# Patient Record
Sex: Male | Born: 1970 | Race: White | Hispanic: No | Marital: Married | Smoking: Current some day smoker
Health system: Southern US, Community
[De-identification: ages and names within clinical notes are randomized; demographics above are authoritative.]

## PROBLEM LIST (undated history)

## (undated) DIAGNOSIS — K219 Gastro-esophageal reflux disease without esophagitis: Secondary | ICD-10-CM

## (undated) HISTORY — PX: BACK SURGERY: SHX140

## (undated) HISTORY — DX: Gastro-esophageal reflux disease without esophagitis: K21.9

---

## 2006-11-29 ENCOUNTER — Ambulatory Visit: Payer: Self-pay | Admitting: Family Medicine

## 2008-09-29 ENCOUNTER — Emergency Department: Payer: Self-pay | Admitting: Emergency Medicine

## 2010-08-15 ENCOUNTER — Ambulatory Visit: Payer: Self-pay | Admitting: Family Medicine

## 2010-08-19 ENCOUNTER — Ambulatory Visit: Payer: Self-pay | Admitting: Family Medicine

## 2012-04-28 ENCOUNTER — Ambulatory Visit: Payer: Self-pay | Admitting: Family Medicine

## 2013-07-04 ENCOUNTER — Ambulatory Visit: Payer: Self-pay | Admitting: Family Medicine

## 2015-03-19 ENCOUNTER — Ambulatory Visit (INDEPENDENT_AMBULATORY_CARE_PROVIDER_SITE_OTHER): Payer: BC Managed Care – PPO | Admitting: Family Medicine

## 2015-03-19 ENCOUNTER — Encounter: Payer: Self-pay | Admitting: Family Medicine

## 2015-03-19 VITALS — BP 120/62 | HR 76 | Ht 72.0 in | Wt 226.0 lb

## 2015-03-19 DIAGNOSIS — J0100 Acute maxillary sinusitis, unspecified: Secondary | ICD-10-CM

## 2015-03-19 DIAGNOSIS — K219 Gastro-esophageal reflux disease without esophagitis: Secondary | ICD-10-CM

## 2015-03-19 DIAGNOSIS — J40 Bronchitis, not specified as acute or chronic: Secondary | ICD-10-CM | POA: Diagnosis not present

## 2015-03-19 MED ORDER — PANTOPRAZOLE SODIUM 40 MG PO TBEC: 40.0000 mg | DELAYED_RELEASE_TABLET | Freq: Every day | ORAL | Status: DC

## 2015-03-19 MED ORDER — AMOXICILLIN 500 MG PO CAPS: 500.0000 mg | ORAL_CAPSULE | Freq: Three times a day (TID) | ORAL | Status: DC

## 2015-03-19 MED ORDER — GUAIFENESIN-CODEINE 100-10 MG/5ML PO SOLN: 5.0000 mL | Freq: Three times a day (TID) | ORAL | Status: DC | PRN

## 2015-03-19 NOTE — Patient Instructions (Signed)
Smoking: Ways to Quit   Know why you want to quit.   When you quit smoking, your body gets to work repairing damaged tissues. Here are some of the health benefits:   You stop the destruction of your lungs.  Your lungs are better able to fight colds, and other respiratory infections.  You decrease your risk of cancer, heart disease, strokes, and circulation problems.   In addition, when you quit you will:   Feel more in control of your life.  Have better smelling hair, breath, clothes, home, and car.  Have more stamina for activities.  Save money.  Protect your family and friends from the dangers of secondhand smoke.   Smoking is an addictive habit. Most former smokers make several attempts to quit before they finally succeed. So, never say, "I can't." Just keep trying.   Set a quit date.   Set a date for when you will stop smoking. Don't buy cigarettes to carry you beyond your last day. Tell your family and friends you plan to quit, and ask for their support and encouragement. Ask them not to offer you cigarettes.  Make a plan.   5 Days Before Your Quit Date   Think about your reasons for quitting.  Tell your friends and family you are planning to quit.  Stop buying cigarettes.   4 Days Before Your Quit Date   Pay attention to when and why you smoke.  Think of other things to hold in your hand instead of a cigarette.  Think of habits or routines to change.   3 Days Before Your Quit Date   Plan what you will do with the extra money when you stop buying cigarettes.  Think of whom you can reach out to when you need help.   2 Days Before Your Quit Date   Consider buying nonprescription nicotine patches or nicotine gum. Or see your health care provider to get a prescription for the nicotine inhaler, nasal spray, or other medicine that can help.     1 Day Before Your Quit Date   Put away lighters and ashtrays.  Throw away all cigarettes and matches - no emergency stashes  are allowed!  Clean your clothes to get rid of the smell of cigarette smoke.   Quit Day   Keep very busy.  Remind family and friends that this is your quit day.  Stay away from alcohol.  Stay away from places where you used to smoke and people you used to smoke with.  Give yourself a treat or do something else special.   Commit to staying quit.   Make sure that all your cigarettes and ashtrays are thrown away.   If you keep cigarettes or ashtrays around, sooner or later you'll break down and smoke one, then another, then another, and so on. Throw them away. Make it hard to start again.   Because you are used to having something in your mouth, you may want to chew gum as a substitute for smoking. Or munch on carrots or celery.   Spend time with nonsmokers rather than with smokers.   Think of yourself as a nonsmoker. Tell other people that you are a nonsmoker (for example, in restaurants). Stay away from places where there are a lot of smokers, such as bars. Avoid spending time with smokers. You can't tell others not to smoke, but you don't have to sit with them while they do. Plan on walking away from cigarette smoke. Spend time   with nonsmokers and sit in the nonsmoking section of restaurants.   Be prepared for relapse or difficult situations.   Most people who go back to smoking cigarettes do so within the first 3 months after quitting. Many people try 5 or more times before they successfully quit. Avoid drinking alcohol, because it lowers your chances of success. Don't be distracted by the weight you may gain after quitting. Smokers usually do not gain more than 10 pounds when they stop smoking. Learn new ways to improve your mood and overcome depression.   Start an exercise program.   As you become more fit, you will not want the nicotine effects in your body. Regular exercise will also help keep you from gaining weight.   Keep your hands busy.   You may not know what to do with  your hands for a while. Try reading, knitting, needlework, pottery, drawing, making a plastic model, or doing a jigsaw puzzle. Join special interest groups that keep you involved in your hobby.      Take on new activities.   Change your routine. Take on new activities that don't include smoking. Join an exercise group and work out regularly. Sign up for an evening class or a join a study group at your place of worship. Go on more outings with your family or friends. Learn ways to relax and manage stress.   Join a quit-smoking program if it helps.   Some people do better in groups, or with a set of instructions to follow. That's fine, too. Remember, the goal is to quit smoking. It doesn't matter how you do it.   Consider using nicotine replacement therapy.   Nicotine is the drug that is in tobacco. You can use nicotine patches or gum, available without a prescription at your local pharmacy, to help you quit smoking. Quitting smoking is a two-step process. It includes breaking the physical addiction to nicotine and breaking the smoking habit. Nicotine replacement helps take care of the nicotine addiction so that you can focus on breaking the habit.   Your health care provider can prescribe nicotine substitutes that can almost double your chances of quitting for good. They are:   Zyban (bupropion HCL)  nicotine inhaler  nicotine lozenge  nicotine nasal spray  nicotine patch.   None of these treatments is a miracle cure. Quitting can be hard work, but you can learn to live without cigarettes in your daily life.  

## 2015-03-19 NOTE — Progress Notes (Signed)
Name: Ricardo RainwaterGary G Capriotti   MRN: [id number]    DOB: July 02, 1970   Date:03/19/2015       Progress Note  Subjective  Chief Complaint  Chief Complaint  Patient presents with  . Sinusitis    coughing at night    Sinusitis This is a new problem. The current episode started in the past 7 days. The problem has been gradually worsening since onset. There has been no fever. His pain is at a severity of 2/10 ("dull headache"). The pain is mild. Associated symptoms include congestion, coughing, headaches, sinus pressure, sneezing and a sore throat. Pertinent negatives include no chills, diaphoresis, ear pain, hoarse voice, neck pain, shortness of breath or swollen glands. Past treatments include acetaminophen and oral decongestants. The treatment provided mild relief.    No problem-specific assessment & plan notes found for this encounter.   Past Medical History  Diagnosis Date  . GERD (gastroesophageal reflux disease)     Past Surgical History  Procedure Laterality Date  . Back surgery      History reviewed. No pertinent family history.  Social History   Social History  . Marital Status: Married    Spouse Name: N/A  . Number of Children: N/A  . Years of Education: N/A   Occupational History  . Not on file.   Social History Main Topics  . Smoking status: Current Some Day Smoker  . Smokeless tobacco: Not on file  . Alcohol Use: No  . Drug Use: No  . Sexual Activity: Yes   Other Topics Concern  . Not on file   Social History Narrative  . No narrative on file    No Known Allergies   Review of Systems  Constitutional: Negative for fever, chills, weight loss, malaise/fatigue and diaphoresis.  HENT: Positive for congestion, sinus pressure, sneezing and sore throat. Negative for ear discharge, ear pain and hoarse voice.   Eyes: Negative for blurred vision.  Respiratory: Positive for cough. Negative for sputum production, shortness of breath and wheezing.        Prod green   Cardiovascular: Negative for chest pain, palpitations and leg swelling.  Gastrointestinal: Positive for heartburn. Negative for nausea, abdominal pain, diarrhea, constipation, blood in stool and melena.  Genitourinary: Negative for dysuria, urgency, frequency and hematuria.  Musculoskeletal: Negative for myalgias, back pain, joint pain and neck pain.  Skin: Negative for rash.  Neurological: Positive for headaches. Negative for dizziness, tingling, sensory change and focal weakness.  Endo/Heme/Allergies: Negative for environmental allergies and polydipsia. Does not bruise/bleed easily.  Psychiatric/Behavioral: Negative for depression and suicidal ideas. The patient is not nervous/anxious and does not have insomnia.      Objective  Filed Vitals:   03/19/15 0917  BP: 120/62  Pulse: 76  Height: 6' (1.829 m)  Weight: 226 lb (102.513 kg)    Physical Exam  Constitutional: He is oriented to person, place, and time and well-developed, well-nourished, and in no distress.  HENT:  Head: Normocephalic.  Right Ear: External ear normal.  Left Ear: External ear normal.  Nose: Nose normal.  Mouth/Throat: Oropharynx is clear and moist.  Eyes: Conjunctivae and EOM are normal. Pupils are equal, round, and reactive to light. Right eye exhibits no discharge. Left eye exhibits no discharge. No scleral icterus.  Neck: Normal range of motion. Neck supple. No JVD present. No tracheal deviation present. No thyromegaly present.  Cardiovascular: Normal rate, regular rhythm, normal heart sounds and intact distal pulses.  Exam reveals no gallop and no friction rub.  No murmur heard. Pulmonary/Chest: Breath sounds normal. No respiratory distress. He has no wheezes. He has no rales.  Abdominal: Soft. Bowel sounds are normal. He exhibits no mass. There is no hepatosplenomegaly. There is no tenderness. There is no rebound, no guarding and no CVA tenderness.  Musculoskeletal: Normal range of motion. He exhibits no  edema or tenderness.  Lymphadenopathy:    He has no cervical adenopathy.  Neurological: He is alert and oriented to person, place, and time. He has normal sensation, normal strength, normal reflexes and intact cranial nerves. No cranial nerve deficit.  Skin: Skin is warm. No rash noted.  Psychiatric: Mood and affect normal.      Assessment & Plan  Problem List Items Addressed This Visit    None    Visit Diagnoses    Acute maxillary sinusitis, recurrence not specified    -  Primary    Relevant Medications    amoxicillin (AMOXIL) 500 MG capsule    guaiFENesin-codeine 100-10 MG/5ML syrup    Bronchitis        Relevant Medications    guaiFENesin-codeine 100-10 MG/5ML syrup    Gastroesophageal reflux disease, esophagitis presence not specified        Relevant Medications    pantoprazole (PROTONIX) 40 MG tablet         Dr. Hayden [facility]  03/19/2015

## 2015-04-23 ENCOUNTER — Encounter: Payer: BC Managed Care – PPO | Admitting: Family Medicine

## 2015-04-26 ENCOUNTER — Ambulatory Visit (INDEPENDENT_AMBULATORY_CARE_PROVIDER_SITE_OTHER): Payer: BC Managed Care – PPO | Admitting: Family Medicine

## 2015-04-26 ENCOUNTER — Encounter: Payer: Self-pay | Admitting: Family Medicine

## 2015-04-26 VITALS — BP 100/60 | HR 68 | Ht 73.0 in | Wt 222.0 lb

## 2015-04-26 DIAGNOSIS — E785 Hyperlipidemia, unspecified: Secondary | ICD-10-CM | POA: Diagnosis not present

## 2015-04-26 DIAGNOSIS — Z0000 Encounter for general adult medical examination without abnormal findings: Secondary | ICD-10-CM | POA: Diagnosis not present

## 2015-04-26 DIAGNOSIS — F17219 Nicotine dependence, cigarettes, with unspecified nicotine-induced disorders: Secondary | ICD-10-CM

## 2015-04-26 DIAGNOSIS — S161XXA Strain of muscle, fascia and tendon at neck level, initial encounter: Secondary | ICD-10-CM

## 2015-04-26 LAB — HEMOCCULT GUIAC POC 1CARD (OFFICE): Fecal Occult Blood, POC: NEGATIVE

## 2015-04-26 NOTE — Progress Notes (Signed)
Name: Ricardo Daniels   MRN: [id number]    DOB: 09-25-1970   Date:04/26/2015       Progress Note  Subjective  Chief Complaint  Chief Complaint  Patient presents with  . Annual Exam  . Neck Pain    feels like "tension on Left side of neck"    Neck Pain  This is a recurrent problem. The current episode started more than 1 year ago. The problem occurs daily. The problem has been waxing and waning. The pain is associated with nothing. The pain is present in the left side. The quality of the pain is described as aching. The pain is at a severity of 3/10. The pain is mild. The symptoms are aggravated by twisting. Pertinent negatives include no chest pain, fever, headaches, leg pain, numbness, pain with swallowing, paresis, photophobia, syncope, tingling, trouble swallowing, visual change, weakness or weight loss. He has tried nothing for the symptoms. The treatment provided no relief.    No problem-specific assessment & plan notes found for this encounter.   Past Medical History  Diagnosis Date  . GERD (gastroesophageal reflux disease)     Past Surgical History  Procedure Laterality Date  . Back surgery      History reviewed. No pertinent family history.  Social History   Social History  . Marital Status: Married    Spouse Name: N/A  . Number of Children: N/A  . Years of Education: N/A   Occupational History  . Not on file.   Social History Main Topics  . Smoking status: Current Some Day Smoker  . Smokeless tobacco: Not on file  . Alcohol Use: No  . Drug Use: No  . Sexual Activity: Yes   Other Topics Concern  . Not on file   Social History Narrative    No Known Allergies   Review of Systems  Constitutional: Negative for fever, chills, weight loss and malaise/fatigue.  HENT: Negative for ear discharge, ear pain, sore throat and trouble swallowing.   Eyes: Negative for blurred vision and photophobia.  Respiratory: Negative for cough, sputum production, shortness of  breath and wheezing.   Cardiovascular: Negative for chest pain, palpitations, leg swelling and syncope.  Gastrointestinal: Negative for heartburn, nausea, abdominal pain, diarrhea, constipation, blood in stool and melena.  Genitourinary: Negative for dysuria, urgency, frequency and hematuria.  Musculoskeletal: Positive for neck pain. Negative for myalgias, back pain and joint pain.  Skin: Negative for rash.  Neurological: Negative for dizziness, tingling, sensory change, focal weakness, weakness, numbness and headaches.  Endo/Heme/Allergies: Negative for environmental allergies and polydipsia. Does not bruise/bleed easily.  Psychiatric/Behavioral: Negative for depression and suicidal ideas. The patient is not nervous/anxious and does not have insomnia.      Objective  Filed Vitals:   04/26/15 0955  BP: 100/60  Pulse: 68  Height: 6\' 1"  (1.854 m)  Weight: 222 lb (100.699 kg)    Physical Exam  Constitutional: He is oriented to person, place, and time and well-developed, well-nourished, and in no distress.  HENT:  Head: Normocephalic.  Right Ear: External ear normal.  Left Ear: External ear normal.  Nose: Nose normal.  Mouth/Throat: Oropharynx is clear and moist.  Eyes: Conjunctivae and EOM are normal. Pupils are equal, round, and reactive to light. Right eye exhibits no discharge. Left eye exhibits no discharge. No scleral icterus.  Neck: Normal range of motion. Neck supple. No JVD present. No tracheal deviation present. No thyromegaly present.  Cardiovascular: Normal rate, regular rhythm, normal heart sounds and  intact distal pulses.  Exam reveals no gallop and no friction rub.   No murmur heard. Pulmonary/Chest: Breath sounds normal. No respiratory distress. He has no wheezes. He has no rales.  Abdominal: Soft. Bowel sounds are normal. He exhibits no mass. There is no hepatosplenomegaly. There is no tenderness. There is no rebound, no guarding and no CVA tenderness.   Musculoskeletal: Normal range of motion. He exhibits no edema or tenderness.  Lymphadenopathy:    He has no cervical adenopathy.  Neurological: He is alert and oriented to person, place, and time. He has normal sensation, normal strength, normal reflexes and intact cranial nerves. No cranial nerve deficit.  Skin: Skin is warm. No rash noted.  Psychiatric: Mood and affect normal.      Assessment & Plan  Problem List Items Addressed This Visit    None        Dr. Elizabeth Sauereanna Erwin [facility]ishiyama [facility]  04/26/2015

## 2015-04-26 NOTE — Patient Instructions (Signed)
Cervical Sprain  A cervical sprain is an injury in the neck in which the strong, fibrous tissues (ligaments) that connect your neck bones stretch or tear. Cervical sprains can range from mild to severe. Severe cervical sprains can cause the neck vertebrae to be unstable. This can lead to damage of the spinal cord and can result in serious nervous system problems. The amount of time it takes for a cervical sprain to get better depends on the cause and extent of the injury. Most cervical sprains heal in 1 to 3 weeks.  CAUSES   Severe cervical sprains may be caused by:    Contact sport injuries (such as from football, rugby, wrestling, hockey, auto racing, gymnastics, diving, martial arts, or boxing).    Motor vehicle collisions.    Whiplash injuries. This is an injury from a sudden forward and backward whipping movement of the head and neck.   Falls.   Mild cervical sprains may be caused by:    Being in an awkward position, such as while cradling a telephone between your ear and shoulder.    Sitting in a chair that does not offer proper support.    Working at a poorly designed computer station.    Looking up or down for long periods of time.   SYMPTOMS    Pain, soreness, stiffness, or a burning sensation in the front, back, or sides of the neck. This discomfort may develop immediately after the injury or slowly, 24 hours or more after the injury.    Pain or tenderness directly in the middle of the back of the neck.    Shoulder or upper back pain.    Limited ability to move the neck.    Headache.    Dizziness.    Weakness, numbness, or tingling in the hands or arms.    Muscle spasms.    Difficulty swallowing or chewing.    Tenderness and swelling of the neck.   DIAGNOSIS   Most of the time your health care provider can diagnose a cervical sprain by taking your history and doing a physical exam. Your health care provider will ask about previous neck injuries and any known neck  problems, such as arthritis in the neck. X-rays may be taken to find out if there are any other problems, such as with the bones of the neck. Other tests, such as a CT scan or MRI, may also be needed.   TREATMENT   Treatment depends on the severity of the cervical sprain. Mild sprains can be treated with rest, keeping the neck in place (immobilization), and pain medicines. Severe cervical sprains are immediately immobilized. Further treatment is done to help with pain, muscle spasms, and other symptoms and may include:   Medicines, such as pain relievers, numbing medicines, or muscle relaxants.    Physical therapy. This may involve stretching exercises, strengthening exercises, and posture training. Exercises and improved posture can help stabilize the neck, strengthen muscles, and help stop symptoms from returning.   HOME CARE INSTRUCTIONS    Put ice on the injured area.     Put ice in a plastic bag.     Place a towel between your skin and the bag.     Leave the ice on for 15-20 minutes, 3-4 times a day.    If your injury was severe, you may have been given a cervical collar to wear. A cervical collar is a two-piece collar designed to keep your neck from moving while it heals.      Do not remove the collar unless instructed by your health care provider.    If you have long hair, keep it outside of the collar.    Ask your health care provider before making any adjustments to your collar. Minor adjustments may be required over time to improve comfort and reduce pressure on your chin or on the back of your head.    Ifyou are allowed to remove the collar for cleaning or bathing, follow your health care provider's instructions on how to do so safely.    Keep your collar clean by wiping it with mild soap and water and drying it completely. If the collar you have been given includes removable pads, remove them every 1-2 days and hand wash them with soap and water. Allow them to air dry. They should be completely  dry before you wear them in the collar.    If you are allowed to remove the collar for cleaning and bathing, wash and dry the skin of your neck. Check your skin for irritation or sores. If you see any, tell your health care provider.    Do not drive while wearing the collar.    Only take over-the-counter or prescription medicines for pain, discomfort, or fever as directed by your health care provider.    Keep all follow-up appointments as directed by your health care provider.    Keep all physical therapy appointments as directed by your health care provider.    Make any needed adjustments to your workstation to promote good posture.    Avoid positions and activities that make your symptoms worse.    Warm up and stretch before being active to help prevent problems.   SEEK MEDICAL CARE IF:    Your pain is not controlled with medicine.    You are unable to decrease your pain medicine over time as planned.    Your activity level is not improving as expected.   SEEK IMMEDIATE MEDICAL CARE IF:    You develop any bleeding.   You develop stomach upset.   You have signs of an allergic reaction to your medicine.    Your symptoms get worse.    You develop new, unexplained symptoms.    You have numbness, tingling, weakness, or paralysis in any part of your body.   MAKE SURE YOU:    Understand these instructions.   Will watch your condition.   Will get help right away if you are not doing well or get worse.     This information is not intended to replace advice given to you by your health care provider. Make sure you discuss any questions you have with your health care provider.     Document Released: 01/25/2007 Document Revised: 04/04/2013 Document Reviewed: 10/05/2012  Elsevier Interactive Patient Education 2016 [employer]

## 2015-04-27 LAB — RENAL FUNCTION PANEL
Albumin: 4.4 g/dL (ref 3.5–5.5)
BUN / CREAT RATIO: 15 (ref 9–20)
BUN: 17 mg/dL (ref 6–24)
CO2: 25 mmol/L (ref 18–29)
CREATININE: 1.16 mg/dL (ref 0.76–1.27)
Calcium: 9.6 mg/dL (ref 8.7–10.2)
Chloride: 103 mmol/L (ref 96–106)
GFR, EST AFRICAN AMERICAN: 87 mL/min/{1.73_m2} (ref >59)
GFR, EST NON AFRICAN AMERICAN: 75 mL/min/{1.73_m2} (ref >59)
Glucose: 88 mg/dL (ref 65–99)
Phosphorus: 4.1 mg/dL (ref 2.5–4.5)
Potassium: 4.5 mmol/L (ref 3.5–5.2)
SODIUM: 141 mmol/L (ref 134–144)

## 2015-04-27 LAB — LIPID PANEL
CHOL/HDL RATIO: 4.1 ratio (ref 0.0–5.0)
Cholesterol, Total: 158 mg/dL (ref 100–199)
HDL: 39 mg/dL — AB (ref >39)
LDL CALC: 107 mg/dL — AB (ref 0–99)
TRIGLYCERIDES: 59 mg/dL (ref 0–149)
VLDL CHOLESTEROL CAL: 12 mg/dL (ref 5–40)

## 2015-07-23 ENCOUNTER — Ambulatory Visit
Admission: EM | Admit: 2015-07-23 | Discharge: 2015-07-23 | Disposition: A | Discharge status: Home / Self Care | Payer: BC Managed Care – PPO | Attending: Family Medicine | Admitting: Family Medicine

## 2015-07-23 ENCOUNTER — Encounter: Payer: Self-pay | Admitting: *Deleted

## 2015-07-23 ENCOUNTER — Ambulatory Visit (INDEPENDENT_AMBULATORY_CARE_PROVIDER_SITE_OTHER): Payer: BC Managed Care – PPO

## 2015-07-23 DIAGNOSIS — Z79899 Other long term (current) drug therapy: Secondary | ICD-10-CM | POA: Insufficient documentation

## 2015-07-23 DIAGNOSIS — M940 Chondrocostal junction syndrome [Tietze]: Secondary | ICD-10-CM | POA: Insufficient documentation

## 2015-07-23 DIAGNOSIS — R0602 Shortness of breath: Secondary | ICD-10-CM | POA: Diagnosis not present

## 2015-07-23 DIAGNOSIS — F17200 Nicotine dependence, unspecified, uncomplicated: Secondary | ICD-10-CM | POA: Diagnosis not present

## 2015-07-23 DIAGNOSIS — R079 Chest pain, unspecified: Secondary | ICD-10-CM | POA: Diagnosis not present

## 2015-07-23 DIAGNOSIS — R109 Unspecified abdominal pain: Secondary | ICD-10-CM | POA: Insufficient documentation

## 2015-07-23 DIAGNOSIS — K219 Gastro-esophageal reflux disease without esophagitis: Secondary | ICD-10-CM | POA: Diagnosis not present

## 2015-07-23 DIAGNOSIS — R05 Cough: Secondary | ICD-10-CM | POA: Diagnosis not present

## 2015-07-23 DIAGNOSIS — Z9889 Other specified postprocedural states: Secondary | ICD-10-CM | POA: Insufficient documentation

## 2015-07-23 NOTE — ED Notes (Signed)
Patient reports symptom of left flank pain started 2 days ago. Pain increases during deep breathing and when he moves his left arm.

## 2015-07-23 NOTE — ED Provider Notes (Signed)
CSN: [id number]     Arrival date & time 07/23/15  1324 History   First MD Initiated Contact with Patient 07/23/15 1408     Chief Complaint  Patient presents with  . Flank Pain   (Consider location/radiation/quality/duration/timing/severity/associated sxs/prior Treatment) HPI Comments: 45 yo male with a 2 days h/o left flank, lower chest pain, worse with deep breaths, moving his left arm or coughing. Denies any fevers, chills, shortness of breath, rash, recent surgeries, recent travel, prolonged immobilization, dysuria, hematuria, diarrhea, constipation, melena, hematochezia.   The history is provided by the patient.    Past Medical History  Diagnosis Date  . GERD (gastroesophageal reflux disease)    Past Surgical History  Procedure Laterality Date  . Back surgery     History reviewed. No pertinent family history. Social History  Substance Use Topics  . Smoking status: Current Some Day Smoker  . Smokeless tobacco: Never Used  . Alcohol Use: No    Review of Systems  Allergies  Review of patient's allergies indicates no known allergies.  Home Medications   Prior to Admission medications   Medication Sig Start Date End Date Taking? Authorizing Provider  pantoprazole (PROTONIX) 40 MG tablet Take 1 tablet (40 mg total) by mouth daily. 03/19/15  Yes Duanne Limerickeanna C Jones, MD   Meds Ordered and Administered this Visit  Medications - No data to display  BP 113/66 mmHg  Pulse 80  Temp(Src) 98 F (36.7 C) (Oral)  Resp 18  Ht 6\' 1"  (1.854 m)  Wt 210 lb (95.255 kg)  BMI 27.71 kg/m2  SpO2 98% No data found.   Physical Exam  Constitutional: He appears well-developed and well-nourished. No distress.  HENT:  Head: Normocephalic and atraumatic.  Nose: Nose normal.  Eyes: Conjunctivae and EOM are normal. Pupils are equal, round, and reactive to light. Right eye exhibits no discharge. Left eye exhibits no discharge. No scleral icterus.  Neck: Normal range of motion. Neck supple. No  tracheal deviation present. No thyromegaly present.  Cardiovascular: Normal rate, regular rhythm and normal heart sounds.   Pulmonary/Chest: Effort normal and breath sounds normal. No stridor. No respiratory distress. He has no wheezes. He has no rales. He exhibits tenderness (over left lower ribs).  Lymphadenopathy:    He has no cervical adenopathy.  Neurological: He is alert.  Skin: Skin is warm and dry. No rash noted. He is not diaphoretic.  Nursing note and vitals reviewed.   ED Course  Procedures (including critical care time)  Labs Review Labs Reviewed - No data to display  Imaging Review No results found.   Visual Acuity Review  Right Eye Distance:   Left Eye Distance:   Bilateral Distance:    Right Eye Near:   Left Eye Near:    Bilateral Near:       EKG: normal EKG, normal sinus rhythm","unchanged from previous tracings, reviewed by me and agree  MDM   1. Costochondritis     Discharge Medication List as of 07/23/2015  3:51 PM     1. x-ray results and diagnosis reviewed with patient 2. rx as per orders above; reviewed possible side effects, interactions, risks and benefits  3. Recommend supportive treatment with otc analgesics prn, rest, ice 4. Follow-up prn if symptoms worsen or don't improve   Ricardo Mccallumrlando Berk Pilot, MD 07/31/15 2040

## 2016-08-25 ENCOUNTER — Ambulatory Visit (INDEPENDENT_AMBULATORY_CARE_PROVIDER_SITE_OTHER): Payer: BC Managed Care – PPO

## 2016-08-25 ENCOUNTER — Ambulatory Visit
Admission: EM | Admit: 2016-08-25 | Discharge: 2016-08-25 | Disposition: A | Discharge status: Home / Self Care | Payer: BC Managed Care – PPO | Attending: Family Medicine | Admitting: Family Medicine

## 2016-08-25 DIAGNOSIS — S81812A Laceration without foreign body, left lower leg, initial encounter: Secondary | ICD-10-CM | POA: Diagnosis not present

## 2016-08-25 DIAGNOSIS — W2209XA Striking against other stationary object, initial encounter: Secondary | ICD-10-CM | POA: Diagnosis not present

## 2016-08-25 NOTE — ED Provider Notes (Signed)
[facility]    CSN: [id number] Arrival date & time: 08/25/16  1348     History   Chief Complaint Chief Complaint  Patient presents with  . Extremity Laceration    left lower leg    HPI Ricardo Daniels is a 46 y.o. male.   46 yo male injured left lower leg while he was lowering a lawnmower off a trailer, it slipped and a sharp edge fell on his shin causing a laceration to his shin. Patient states he had a tetanus vaccine 2-3 years ago.    The history is provided by the patient.    Past Medical History:  Diagnosis Date  . GERD (gastroesophageal reflux disease)     There are no active problems to display for this patient.   Past Surgical History:  Procedure Laterality Date  . BACK SURGERY         Home Medications    Prior to Admission medications   Medication Sig Start Date End Date Taking? Authorizing Provider  pantoprazole (PROTONIX) 40 MG tablet Take 1 tablet (40 mg total) by mouth daily. 03/19/15   Duanne Limerick, MD    Family History History reviewed. No pertinent family history.  Social History Social History  Substance Use Topics  . Smoking status: Current Some Day Smoker  . Smokeless tobacco: Never Used  . Alcohol use No     Allergies   Patient has no known allergies.   Review of Systems Review of Systems   Physical Exam Triage Vital Signs ED Triage Vitals  Enc Vitals Group     BP 08/25/16 1401 127/73     Pulse Rate 08/25/16 1401 76     Resp 08/25/16 1401 18     Temp 08/25/16 1401 97.8 F (36.6 C)     Temp Source 08/25/16 1401 Oral     SpO2 08/25/16 1401 100 %     Weight 08/25/16 1400 200 lb (90.7 kg)     Height 08/25/16 1400 6\' 1"  (1.854 m)     Head Circumference --      Peak Flow --      Pain Score 08/25/16 1400 5     Pain Loc --      Pain Edu? --      Excl. in GC? --    No data found.   Updated Vital Signs BP 127/73 (BP Location: Left Arm)   Pulse 76   Temp 97.8 F (36.6 C) (Oral)   Resp 18   Ht 6\' 1"   (1.854 m)   Wt 200 lb (90.7 kg)   SpO2 100%   BMI 26.39 kg/m   Visual Acuity Right Eye Distance:   Left Eye Distance:   Bilateral Distance:    Right Eye Near:   Left Eye Near:    Bilateral Near:     Physical Exam  Constitutional: He appears well-developed and well-nourished. No distress.  Musculoskeletal:       Left lower leg: He exhibits laceration (3cm over left shin (tibia)).  Skin: He is not diaphoretic.  Nursing note and vitals reviewed.    UC Treatments / Results  Labs (all labs ordered are listed, but only abnormal results are displayed) Labs Reviewed - No data to display  EKG  EKG Interpretation None       Radiology Dg Tibia/fibula Left  Result Date: 08/25/2016 CLINICAL DATA:  Injury.  Laceration. EXAM: LEFT TIBIA AND FIBULA - 2 VIEW COMPARISON:  No prior. FINDINGS: No acute bony  or joint abnormality identified. Soft tissue laceration is noted. No radiopaque foreign body . IMPRESSION: 1.  Soft tissue laceration.  No radiopaque foreign body. 2. No acute bony abnormality P Electronically Signed   By: Maisie Fushomas  Register   On: 08/25/2016 14:45    Procedures .Marland Kitchen.Laceration Repair Date/Time: 08/25/2016 3:33 PM Performed by: Payton MccallumONTY, Darby Shadwick Authorized by: Payton MccallumONTY, Rebecka Oelkers   Consent:    Consent obtained:  Verbal   Consent given by:  Patient   Risks discussed:  Infection, need for additional repair, nerve damage, poor wound healing, poor cosmetic result, retained foreign body, vascular damage and pain   Alternatives discussed:  No treatment Anesthesia (see MAR for exact dosages):    Anesthesia method:  Local infiltration   Local anesthetic:  Lidocaine 1% w/o epi Laceration details:    Location:  Leg   Leg location:  L lower leg (over lower shin area)   Length (cm):  3 Repair type:    Repair type:  Simple Pre-procedure details:    Preparation:  Patient was prepped and draped in usual sterile fashion and imaging obtained to evaluate for foreign bodies Exploration:     Hemostasis achieved with:  Direct pressure   Wound exploration: wound explored through full range of motion and entire depth of wound probed and visualized     Wound extent: areolar tissue violated     Wound extent: no fascia violation noted, no foreign bodies/material noted, no muscle damage noted, no nerve damage noted, no tendon damage noted, no underlying fracture noted and no vascular damage noted     Contaminated: no   Treatment:    Area cleansed with:  Hibiclens and Betadine   Amount of cleaning:  Standard   Irrigation solution:  Sterile saline   Irrigation method:  Syringe   Visualized foreign bodies/material removed: yes   Skin repair:    Repair method:  Sutures   Suture size:  4-0   Suture material:  Nylon   Suture technique:  Simple interrupted   Number of sutures:  7 Approximation:    Approximation:  Close Post-procedure details:    Dressing:  Antibiotic ointment and non-adherent dressing   Patient tolerance of procedure:  Tolerated well, no immediate complications   (including critical care time)  Medications Ordered in UC Medications - No data to display   Initial Impression / Assessment and Plan / UC Course  I have reviewed the triage vital signs and the nursing notes.  Pertinent labs & imaging results that were available during my care of the patient were reviewed by me and considered in my medical decision making (see chart for details).       Final Clinical Impressions(s) / UC Diagnoses   Final diagnoses:  Laceration of left lower extremity, initial encounter    New Prescriptions Discharge Medication List as of 08/25/2016  3:19 PM     1. x-ray results (negative for fracture or foreign body)  and diagnosis reviewed with patient 2. Laceration repair as per procedure above 3. Recommend supportive treatment with wound care (verbal and written information given) 4. Follow-up in 8 days for suture removal or sooner prn if symptoms    Payton Mccallumonty, Keenen Roessner,  MD 08/25/16 1540

## 2016-08-25 NOTE — ED Triage Notes (Signed)
Pt slipped on a trailer and cut the front of his shin.

## 2016-08-25 NOTE — Discharge Instructions (Signed)
Follow up in 8 days for suture removal.

## 2017-07-20 ENCOUNTER — Other Ambulatory Visit: Payer: Self-pay

## 2017-07-20 ENCOUNTER — Encounter: Payer: Self-pay | Admitting: Emergency Medicine

## 2017-07-20 ENCOUNTER — Ambulatory Visit
Admission: EM | Admit: 2017-07-20 | Discharge: 2017-07-20 | Disposition: A | Discharge status: Home / Self Care | Payer: BC Managed Care – PPO | Attending: Family Medicine | Admitting: Family Medicine

## 2017-07-20 ENCOUNTER — Ambulatory Visit (INDEPENDENT_AMBULATORY_CARE_PROVIDER_SITE_OTHER): Payer: BC Managed Care – PPO

## 2017-07-20 DIAGNOSIS — M79661 Pain in right lower leg: Secondary | ICD-10-CM

## 2017-07-20 MED ORDER — DOXYCYCLINE HYCLATE 100 MG PO CAPS: 100.0000 mg | ORAL_CAPSULE | Freq: Two times a day (BID) | ORAL | 0 refills | Status: DC

## 2017-07-20 MED ORDER — MELOXICAM 15 MG PO TABS: 15.0000 mg | ORAL_TABLET | Freq: Every day | ORAL | 0 refills | Status: DC | PRN

## 2017-07-20 NOTE — Discharge Instructions (Signed)
Rest.  Medications as prescribed.  Take care  Dr. Dalila Arca  

## 2017-07-20 NOTE — ED Triage Notes (Signed)
Patient in today c/o 3 day history of right shin pain. No injury noted.

## 2017-07-20 NOTE — ED Provider Notes (Signed)
[facility]  CSN: [id number] Arrival date & time: 07/20/17  1459  History   Chief Complaint Chief Complaint  Patient presents with  . shin pain    right   HPI  47 year old male presents with right anterior lower leg pain.  Patient reports a 3-day history of right anterior lower leg pain.  He does not recall any fall, trauma, injury.  He states that his pain is mild in severity, 2-3/10.  Worse with ambulation.  No relieving factors.  He reports that the area is warm to the touch and slightly red.  He has had some breaks in the skin.  Additionally, he states that he walks upwards of 20 miles a day.  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  No relieving factors.  No other associated symptoms.  No other complaints.  Past Medical History:  Diagnosis Date  . GERD (gastroesophageal reflux disease)    Past Surgical History:  Procedure Laterality Date  . BACK SURGERY     Home Medications    Prior to Admission medications   Medication Sig Start Date End Date Taking? Authorizing Provider  doxycycline (VIBRAMYCIN) 100 MG capsule Take 1 capsule (100 mg total) by mouth 2 (two) times daily. 07/20/17   Tommie Sams, DO  meloxicam (MOBIC) 15 MG tablet Take 1 tablet (15 mg total) by mouth daily as needed. 07/20/17   Tommie Sams, DO   Family History Family History  Problem Relation Age of Onset  . Healthy Mother   . Healthy Father    Social History Social History   Tobacco Use  . Smoking status: Current Some Day Smoker  . Smokeless tobacco: Never Used  Substance Use Topics  . Alcohol use: No    Alcohol/week: 0.0 oz  . Drug use: No   Allergies   Patient has no known allergies.   Review of Systems Review of Systems   Physical Exam Triage Vital Signs ED Triage Vitals  Enc Vitals Group     BP 07/20/17 1514 135/68     Pulse Rate 07/20/17 1514 72     Resp 07/20/17 1514 16     Temp 07/20/17 1514 97.9 F (36.6 C)     Temp Source 07/20/17 1514 Oral     SpO2  07/20/17 1514 100 %     Weight 07/20/17 1513 210 lb (95.3 kg)     Height 07/20/17 1513 6\' 1"  (1.854 m)     Head Circumference --      Peak Flow --      Pain Score 07/20/17 1513 4     Pain Loc --      Pain Edu? --      Excl. in GC? --    Updated Vital Signs BP 135/68 (BP Location: Left Arm)   Pulse 72   Temp 97.9 F (36.6 C) (Oral)   Resp 16   Ht 6\' 1"  (1.854 m)   Wt 210 lb (95.3 kg)   SpO2 100%   BMI 27.71 kg/m  Physical Exam  Constitutional: He is oriented to person, place, and time. He appears well-developed. No distress.  Pulmonary/Chest: Effort normal. No respiratory distress.  Musculoskeletal:  Patient with tenderness to palpation of the anterior tibia of the right lower extremity.  Neurological: He is alert and oriented to person, place, and time.  Skin:     Patient with a discrete area of redness and warmth the right anterior lower leg.  Psychiatric: He has a normal mood  and affect. His behavior is normal.  Nursing note and vitals reviewed.    UC Treatments / Results  Labs (all labs ordered are listed, but only abnormal results are displayed) Labs Reviewed - No data to display  EKG None Radiology Dg Tibia/fibula Right  Result Date: 07/20/2017 CLINICAL DATA:  Shin pain and redness for 3 days, no injury. EXAM: RIGHT TIBIA AND FIBULA - 2 VIEW COMPARISON:  None. FINDINGS: There is no evidence of fracture or other focal bone lesions. Soft tissues are unremarkable. IMPRESSION: Negative. Electronically Signed   By: Awilda Metroourtnay  Bloomer M.D.   On: 07/20/2017 15:43    Procedures Procedures (including critical care time)  Medications Ordered in UC Medications - No data to display   Initial Impression / Assessment and Plan / UC Course  I have reviewed the triage vital signs and the nursing notes.  Pertinent labs & imaging results that were available during my care of the patient were reviewed by me and considered in my medical decision making (see chart for  details).    47 year old male presents with pain in his right lower leg.  Questionable cellulitis.  Covering with doxycycline.  Meloxicam for MSK pain.  Advised rest.  Final Clinical Impressions(s) / UC Diagnoses   Final diagnoses:  Pain in right lower leg    ED Discharge Orders        Ordered    meloxicam (MOBIC) 15 MG tablet  Daily PRN     07/20/17 1546    doxycycline (VIBRAMYCIN) 100 MG capsule  2 times daily     07/20/17 1546     Controlled Substance Prescriptions [place] Controlled Substance Registry consulted? Not Applicable   Tommie SamsCook, Wally Behan G, DO 07/20/17 1550

## 2017-07-30 ENCOUNTER — Ambulatory Visit: Payer: BC Managed Care – PPO | Admitting: Family Medicine

## 2017-07-30 ENCOUNTER — Encounter: Payer: Self-pay | Admitting: Family Medicine

## 2017-07-30 VITALS — BP 120/80 | HR 80 | Ht 73.0 in | Wt 218.0 lb

## 2017-07-30 DIAGNOSIS — L03115 Cellulitis of right lower limb: Secondary | ICD-10-CM

## 2017-07-30 MED ORDER — DOXYCYCLINE HYCLATE 100 MG PO TABS: 100.0000 mg | ORAL_TABLET | Freq: Two times a day (BID) | ORAL | 0 refills | Status: DC

## 2017-07-30 NOTE — Progress Notes (Signed)
Name: Ricardo Daniels   MRN: [id number]    DOB: 1971-03-29   Date:07/30/2017       Progress Note  Subjective  Chief Complaint  Chief Complaint  Patient presents with  . Leg Swelling    finished doxy    Leg Pain   The incident occurred more than 1 week ago. There was no injury mechanism. Pain location: right lower leg. The quality of the pain is described as aching. The pain is mild. The pain has been improving since onset. Pertinent negatives include no inability to bear weight, loss of motion, loss of sensation, muscle weakness, numbness or tingling. He reports no foreign bodies present. The symptoms are aggravated by palpation. The treatment provided mild relief.    No problem-specific Assessment & Plan notes found for this encounter.   Past Medical History:  Diagnosis Date  . GERD (gastroesophageal reflux disease)     Past Surgical History:  Procedure Laterality Date  . BACK SURGERY      Family History  Problem Relation Age of Onset  . Healthy Mother   . Healthy Father     Social History   Socioeconomic History  . Marital status: Married    Spouse name: Not on file  . Number of children: Not on file  . Years of education: Not on file  . Highest education level: Not on file  Occupational History  . Not on file  Social Needs  . Financial resource strain: Not on file  . Food insecurity:    Worry: Not on file    Inability: Not on file  . Transportation needs:    Medical: Not on file    Non-medical: Not on file  Tobacco Use  . Smoking status: Current Some Day Smoker  . Smokeless tobacco: Never Used  Substance and Sexual Activity  . Alcohol use: No    Alcohol/week: 0.0 oz  . Drug use: No  . Sexual activity: Yes  Lifestyle  . Physical activity:    Days per week: Not on file    Minutes per session: Not on file  . Stress: Not on file  Relationships  . Social connections:    Talks on phone: Not on file    Gets together: Not on file    Attends religious  service: Not on file    Active member of club or organization: Not on file    Attends meetings of clubs or organizations: Not on file    Relationship status: Not on file  . Intimate partner violence:    Fear of current or ex partner: Not on file    Emotionally abused: Not on file    Physically abused: Not on file    Forced sexual activity: Not on file  Other Topics Concern  . Not on file  Social History Narrative  . Not on file    No Known Allergies  Outpatient Medications Prior to Visit  Medication Sig Dispense Refill  . meloxicam (MOBIC) 15 MG tablet Take 1 tablet (15 mg total) by mouth daily as needed. 30 tablet 0  . doxycycline (VIBRAMYCIN) 100 MG capsule Take 1 capsule (100 mg total) by mouth 2 (two) times daily. 14 capsule 0   No facility-administered medications prior to visit.     Review of Systems  Constitutional: Negative for chills, fever, malaise/fatigue and weight loss.  HENT: Negative for ear discharge, ear pain and sore throat.   Eyes: Negative for blurred vision.  Respiratory: Negative for cough, sputum production,  shortness of breath and wheezing.   Cardiovascular: Negative for chest pain, palpitations and leg swelling.  Gastrointestinal: Negative for abdominal pain, blood in stool, constipation, diarrhea, heartburn, melena and nausea.  Genitourinary: Negative for dysuria, frequency, hematuria and urgency.  Musculoskeletal: Negative for back pain, joint pain, myalgias and neck pain.  Skin: Negative for rash.  Neurological: Negative for dizziness, tingling, sensory change, focal weakness, numbness and headaches.  Endo/Heme/Allergies: Negative for environmental allergies and polydipsia. Does not bruise/bleed easily.  Psychiatric/Behavioral: Negative for depression and suicidal ideas. The patient is not nervous/anxious and does not have insomnia.      Objective  Vitals:   07/30/17 1001  BP: 120/80  Pulse: 80  Weight: 218 lb (98.9 kg)  Height: 6\' 1"  (1.854  m)    Physical Exam  Constitutional: He is oriented to person, place, and time.  HENT:  Head: Normocephalic.  Right Ear: External ear normal.  Left Ear: External ear normal.  Nose: Nose normal.  Mouth/Throat: Oropharynx is clear and moist.  Eyes: Pupils are equal, round, and reactive to light. Conjunctivae and EOM are normal. Right eye exhibits no discharge. Left eye exhibits no discharge. No scleral icterus.  Neck: Normal range of motion. Neck supple. No JVD present. No tracheal deviation present. No thyromegaly present.  Cardiovascular: Normal rate, regular rhythm, normal heart sounds and intact distal pulses. Exam reveals no gallop and no friction rub.  No murmur heard. Pulmonary/Chest: Breath sounds normal. No respiratory distress. He has no wheezes. He has no rales.  Abdominal: Soft. Bowel sounds are normal. He exhibits no mass. There is no hepatosplenomegaly. There is no tenderness. There is no rebound, no guarding and no CVA tenderness.  Musculoskeletal: Normal range of motion. He exhibits no edema or tenderness.  Lymphadenopathy:    He has no cervical adenopathy.  Neurological: He is alert and oriented to person, place, and time. He has normal strength and normal reflexes. No cranial nerve deficit.  Skin: Skin is warm. No rash noted.  Nursing note and vitals reviewed.     Assessment & Plan  Problem List Items Addressed This Visit    None    Visit Diagnoses    Cellulitis of right lower extremity    -  Primary   Relevant Medications   doxycycline (VIBRA-TABS) 100 MG tablet      Meds ordered this encounter  Medications  . doxycycline (VIBRA-TABS) 100 MG tablet    Sig: Take 1 tablet (100 mg total) by mouth 2 (two) times daily.    Dispense:  20 tablet    Refill:  0      Dr. Hayden Rasmusseneanna Chauntay Paszkiewicz [facility]  07/30/17

## 2017-10-18 ENCOUNTER — Other Ambulatory Visit: Payer: Self-pay

## 2017-10-19 ENCOUNTER — Encounter: Payer: Self-pay | Admitting: Family Medicine

## 2017-10-19 ENCOUNTER — Ambulatory Visit: Payer: BC Managed Care – PPO | Admitting: Family Medicine

## 2017-10-19 VITALS — BP 118/82 | HR 66 | Ht 73.0 in | Wt 217.0 lb

## 2017-10-19 DIAGNOSIS — G44001 Cluster headache syndrome, unspecified, intractable: Secondary | ICD-10-CM

## 2017-10-19 MED ORDER — SUMATRIPTAN 20 MG/ACT NA SOLN: 20.0000 mg | NASAL | 0 refills | Status: DC | PRN

## 2017-10-19 NOTE — Progress Notes (Signed)
Name: Ricardo Daniels   MRN: [id number]    DOB: May 05, 1970   Date:10/19/2017       Progress Note  Subjective  Chief Complaint  Chief Complaint  Patient presents with  . Headache    pt stated nausea, blurry vision--1 week    Headache   This is a recurrent problem. The current episode [place] The problem occurs intermittently (duration90 min). The problem has been unchanged. The pain is located in the retro-orbital, frontal and left unilateral (?trigeminal distribution) region. The pain does not radiate. The pain quality is similar to prior headaches. The quality of the pain is described as pulsating, throbbing, shooting and stabbing. The pain is at a severity of 2/10. The pain is moderate. Associated symptoms include blurred vision, eye pain, eye watering, nausea, phonophobia, photophobia, scalp tenderness and sinus pressure. Pertinent negatives include no abdominal pain, abnormal behavior, anorexia, back pain, coughing, dizziness, drainage, ear pain, eye redness, facial sweating, fever, hearing loss, insomnia, loss of balance, muscle aches, neck pain, numbness, rhinorrhea, seizures, sore throat, swollen glands, tingling, tinnitus, visual change, vomiting, weakness or weight loss. Nothing aggravates the symptoms. He has tried acetaminophen and NSAIDs (sumatriptan in past) for the symptoms. The treatment provided moderate relief. His past medical history is significant for cluster headaches. There is no history of recent head traumas or TMJ.    No problem-specific Assessment & Plan notes found for this encounter.   Past Medical History:  Diagnosis Date  . GERD (gastroesophageal reflux disease)     Past Surgical History:  Procedure Laterality Date  . BACK SURGERY      Family History  Problem Relation Age of Onset  . Healthy Mother   . Healthy Father     Social History   Socioeconomic History  . Marital status: Married    Spouse name: Not on  file  . Number of children: Not on file  . Years of education: Not on file  . Highest education level: Not on file  Occupational History  . Not on file  Social Needs  . Financial resource strain: Not on file  . Food insecurity:    Worry: Not on file    Inability: Not on file  . Transportation needs:    Medical: Not on file    Non-medical: Not on file  Tobacco Use  . Smoking status: Current Some Day Smoker  . Smokeless tobacco: Never Used  Substance and Sexual Activity  . Alcohol use: No    Alcohol/week: 0.0 oz  . Drug use: No  . Sexual activity: Yes  Lifestyle  . Physical activity:    Days per week: Not on file    Minutes per session: Not on file  . Stress: Not on file  Relationships  . Social connections:    Talks on phone: Not on file    Gets together: Not on file    Attends religious service: Not on file    Active member of club or organization: Not on file    Attends meetings of clubs or organizations: Not on file    Relationship status: Not on file  . Intimate partner violence:    Fear of current or ex partner: Not on file    Emotionally abused: Not on file    Physically abused: Not on file    Forced sexual activity: Not on file  Other Topics Concern  . Not on file  Social History Narrative  .  Not on file    No Known Allergies  Outpatient Medications Prior to Visit  Medication Sig Dispense Refill  . doxycycline (VIBRA-TABS) 100 MG tablet Take 1 tablet (100 mg total) by mouth 2 (two) times daily. 20 tablet 0  . meloxicam (MOBIC) 15 MG tablet Take 1 tablet (15 mg total) by mouth daily as needed. 30 tablet 0   No facility-administered medications prior to visit.     Review of Systems  Constitutional: Negative for chills, fever, malaise/fatigue and weight loss.  HENT: Positive for sinus pressure. Negative for ear discharge, ear pain, hearing loss, rhinorrhea, sore throat and tinnitus.   Eyes: Positive for blurred vision, photophobia and pain. Negative for  redness.  Respiratory: Negative for cough, sputum production, shortness of breath and wheezing.   Cardiovascular: Negative for chest pain, palpitations and leg swelling.  Gastrointestinal: Positive for nausea. Negative for abdominal pain, anorexia, blood in stool, constipation, diarrhea, heartburn, melena and vomiting.  Genitourinary: Negative for dysuria, frequency, hematuria and urgency.  Musculoskeletal: Negative for back pain, joint pain, myalgias and neck pain.  Skin: Negative for rash.  Neurological: Positive for headaches. Negative for dizziness, tingling, sensory change, focal weakness, seizures, weakness, numbness and loss of balance.  Endo/Heme/Allergies: Negative for environmental allergies and polydipsia. Does not bruise/bleed easily.  Psychiatric/Behavioral: Negative for depression and suicidal ideas. The patient is not nervous/anxious and does not have insomnia.      Objective  Vitals:   10/19/17 1630  BP: 118/82  Pulse: 66  SpO2: 100%  Weight: 217 lb (98.4 kg)  Height: 6\' 1"  (1.854 m)    Physical Exam  Constitutional: He is oriented to person, place, and time.  HENT:  Head: Normocephalic.  Right Ear: External ear normal.  Left Ear: External ear normal.  Nose: Nose normal.  Mouth/Throat: Oropharynx is clear and moist.  Eyes: Pupils are equal, round, and reactive to light. Conjunctivae and EOM are normal. Right eye exhibits no discharge. Left eye exhibits no discharge. No scleral icterus.  Neck: Normal range of motion. Neck supple. No JVD present. No tracheal deviation present. No thyromegaly present.  Cardiovascular: Normal rate, regular rhythm, normal heart sounds and intact distal pulses. Exam reveals no gallop and no friction rub.  No murmur heard. Pulmonary/Chest: Breath sounds normal. No respiratory distress. He has no wheezes. He has no rales.  Abdominal: Soft. Bowel sounds are normal. He exhibits no mass. There is no hepatosplenomegaly. There is no  tenderness. There is no rebound, no guarding and no CVA tenderness.  Musculoskeletal: Normal range of motion. He exhibits no edema or tenderness.  Lymphadenopathy:    He has no cervical adenopathy.  Neurological: He is alert and oriented to person, place, and time. He has normal strength and normal reflexes. No cranial nerve deficit or sensory deficit.  Reflex Scores:      Tricep reflexes are 2+ on the right side and 2+ on the left side.      Bicep reflexes are 2+ on the right side and 2+ on the left side.      Brachioradialis reflexes are 2+ on the right side and 2+ on the left side.      Patellar reflexes are 2+ on the right side and 2+ on the left side.      Achilles reflexes are 2+ on the right side and 2+ on the left side. Skin: Skin is warm. No rash noted.  Nursing note and vitals reviewed.     Assessment & Plan  Problem List  Items Addressed This Visit    None    Visit Diagnoses    Intractable cluster headache syndrome, unspecified chronicity pattern    -  Primary   Simular to previous episodes 18-24 mos ago.Sumatryptan nasal prescribed. Neurology appt made. to er if worsens   Relevant Medications   SUMAtriptan (IMITREX) 20 MG/ACT nasal spray   Other Relevant Orders   Ambulatory referral to Neurology      Meds ordered this encounter  Medications  . SUMAtriptan (IMITREX) 20 MG/ACT nasal spray    Sig: Place 1 spray (20 mg total) into the nose every 2 (two) hours as needed for migraine or headache. May repeat in 2 hours if headache persists or recurs.    Dispense:  1 Inhaler    Refill:  0      Dr. Hayden [facility]  10/19/17

## 2017-10-19 NOTE — Patient Instructions (Addendum)
Cluster Headache A cluster headache is a type of headache that causes deep, intense head pain. Cluster headaches can last from 15 minutes to 3 hours. They usually occur:  On one side of the head. They may occur on the other side when a new cluster of headaches begins.  Repeatedly over weeks to months.  Several times a day.  At the same time of day, often at night.  More often in the fall and springtime.  What are the causes? The cause of this condition is not known. What increases the risk? This condition is more likely to develop in:  Males.  People who drink alcohol.  People who smoke or use products that contain nicotine or tobacco.  People who take medicines that cause blood vessels to expand, such as nitroglycerin.  People who take antihistamines.  What are the signs or symptoms? Symptoms of this condition include:  Severe pain on one side of the head that begins behind or around your eye or temple.  Pain on one side of the head.  Nausea.  Sensitivity to light.  Runny nose and nasal stuffiness.  Sweaty, pale skin on the face.  Droopy or swollen eyelid, eye redness, or tearing.  Restlessness and agitation.  How is this diagnosed? This condition may be diagnosed based on:  Your symptoms.  A physical exam.  Your health care provider may order tests to see if your headaches are caused by another medical condition. These tests may show that you do not have cluster headaches. Tests may include:  A CT scan of your head.  An MRI of your head.  Lab tests.  How is this treated? This condition may be treated with:  Medicines to relieve pain and to prevent repeated (recurrent) attacks. Some people may need a combination of medicines.  Oxygen. This helps to relieve pain.  Follow these instructions at home: Headache diary Keep a headache diary as told by your health care provider. Doing this can help you and your health care provider figure out what  triggers your headaches. In your headache diary, include information about:  The time of day that your headache started and what you were doing when it began.  How long your headache lasted.  Where your pain started and whether it moved to other areas.  The type of pain, such as burning, stabbing, throbbing, or cramping.  Your level of pain. Use a pain scale and rate the pain with a number from 1 (mild) up to 10 (severe).  The treatment that you used, and any change in symptoms after treatment.  Medicines  Take over-the-counter and prescription medicines only as told by your health care provider.  Do not drive or use heavy machinery while taking prescription pain medicine.  Use oxygen as told by your health care provider. Lifestyle  Follow a regular sleep schedule. Do not vary the time that you go to bed or the amount that you sleep from day to day. It is important to stay on the same schedule during a cluster period to help prevent headaches.  Exercise regularly.  Eat a healthy diet and avoid foods that may trigger your headaches.  Avoid alcohol.  Do not use any products that contain nicotine or tobacco, such as cigarettes and e-cigarettes. If you need help quitting, ask your health care provider. Contact a health care provider if:  Your headaches change, become more severe, or occur more often.  The medicine or oxygen that your health care provider recommended does  not help. Get help right away if:  You faint.  You have weakness or numbness, especially on one side of your body or face.  You have double vision.  You have nausea or vomiting that does not go away within several hours.  You have trouble talking, walking, or keeping your balance.  You have pain or stiffness in your neck.  You have a fever. Summary  A cluster headache is a type of headache that causes deep, intense head pain, usually on one side of the head.  Keep a headache diary to help discover  what triggers your headaches.  A regular sleep schedule can help prevent headaches. This information is not intended to replace advice given to you by your health care provider. Make sure you discuss any questions you have with your health care provider. Document Released: 03/30/2005 Document Revised: 12/10/2015 Document Reviewed: 12/10/2015 Elsevier Interactive Patient Education  2018 [employer].  Trigeminal Neuralgia Trigeminal neuralgia is a nerve disorder that causes attacks of severe facial pain. The attacks last from a few seconds to several minutes. They can happen for days, weeks, or months and then go away for months or years. Trigeminal neuralgia is also called tic douloureux. What are the causes? This condition is caused by damage to a nerve in the face that is called the trigeminal nerve. An attack can be triggered by:  Talking.  Chewing.  Putting on makeup.  Washing your face.  Shaving your face.  Brushing your teeth.  Touching your face.  What increases the risk? This condition is more likely to develop in:  Women.  People who are 49 years of age or older.  What are the signs or symptoms? The main symptom of this condition is pain in the jaw, lips, eyes, nose, scalp, forehead, and face. The pain may be intense, stabbing, electric, or shock-like. How is this diagnosed? This condition is diagnosed with a physical exam. A CT scan or MRI may be done to rule out other conditions that can cause facial pain. How is this treated? This condition may be treated with:  Avoiding the things that trigger your attacks.  Pain medicine.  Surgery. This may be done in severe cases if other medical treatment does not provide relief.  Follow these instructions at home:  Take over-the-counter and prescription medicines only as told by your health care provider.  If you wish to get pregnant, talk with your health care provider before you start trying to get  pregnant.  Avoid the things that trigger your attacks. It may help to: ? Chew on the unaffected side of your mouth. ? Avoid touching your face. ? Avoid blasts of hot or cold air. Contact a health care provider if:  Your pain medicine is not helping.  You develop new, unexplained symptoms, such as: ? Double vision. ? Facial weakness. ? Changes in hearing or balance.  You become pregnant. Get help right away if:  Your pain is unbearable, and your pain medicine does not help. This information is not intended to replace advice given to you by your health care provider. Make sure you discuss any questions you have with your health care provider. Document Released: 03/27/2000 Document Revised: 12/01/2015 Document Reviewed: 07/23/2014 Elsevier Interactive Patient Education  2018 [employer].  Analgesic Rebound Headache An analgesic rebound headache, sometimes called a medication overuse headache, is a headache that comes after pain medicine (analgesic) taken to treat the original (primary) headache has worn off. Any type of primary headache  can return as a rebound headache if a person regularly takes analgesics more than three times a week to treat it. The types of primary headaches that are commonly associated with rebound headaches include:  Migraines.  Headaches that arise from tense muscles in the head and neck area (tension headaches).  Headaches that develop and happen again (recur) on one side of the head and around the eye (cluster headaches).  If rebound headaches continue, they become chronic daily headaches. What are the causes? This condition may be caused by frequent use of:  Over-the-counter medicines such as aspirin, ibuprofen, and acetaminophen.  Sinus relief medicines and other medicines that contain caffeine.  Narcotic pain medicines such as codeine and oxycodone.  What are the signs or symptoms? The symptoms of a rebound headache are the same as the symptoms  of the original headache. Some of the symptoms of specific types of headaches include: Migraine headache  Pulsing or throbbing pain on one or both sides of the head.  Severe pain that interferes with daily activities.  Pain that is worsened by physical activity.  Nausea, vomiting, or both.  Pain with exposure to bright light, loud noises, or strong smells.  General sensitivity to bright light, loud noises, or strong smells.  Visual changes.  Numbness of one or both arms. Tension headache  Pressure around the head.  Dull, aching head pain.  Pain felt over the front and sides of the head.  Tenderness in the muscles of the head, neck, and shoulders. Cluster headache  Severe pain that begins in or around one eye or temple.  Redness and tearing in the eye on the same side as the pain.  Droopy or swollen eyelid.  One-sided head pain.  Nausea.  Runny nose.  Sweaty, pale facial skin.  Restlessness. How is this diagnosed? This condition is diagnosed by:  Reviewing your medical history. This includes the nature of your primary headaches.  Reviewing the types of pain medicines that you have been using to treat your headaches and how often you take them.  How is this treated? This condition may be treated or managed by:  Discontinuing frequent use of the analgesic medicine. Doing this may worsen your headaches at first, but the pain should eventually become more manageable, less frequent, and less severe.  Seeing a headache specialist. He or she may be able to help you manage your headaches and help make sure there is not another cause of the headaches.  Using methods of stress relief, such as acupuncture, counseling, biofeedback, and massage. Talk with your health care provider about which methods might be good for you.  Follow these instructions at home:  Take over-the-counter and prescription medicines only as told by your health care provider.  Stop the  repeated use of pain medicine as told by your health care provider. Stopping can be difficult. Carefully follow instructions from your health care provider.  Avoid triggers that are known to cause your primary headaches.  Keep all follow-up visits as told by your health care provider. This is important. Contact a health care provider if:  You continue to experience headaches after following treatments that your health care provider recommended. Get help right away if:  You develop new headache pain.  You develop headache pain that is different than what you have experienced in the past.  You develop numbness or tingling in your arms or legs.  You develop changes in your speech or vision. This information is not intended to replace advice  given to you by your health care provider. Make sure you discuss any questions you have with your health care provider. Document Released: 06/20/2003 Document Revised: 10/18/2015 Document Reviewed: 09/02/2015 Elsevier Interactive Patient Education  [employer]2018 [employer] Inc.

## 2017-10-21 ENCOUNTER — Telehealth: Payer: Self-pay

## 2017-10-21 DIAGNOSIS — G44001 Cluster headache syndrome, unspecified, intractable: Secondary | ICD-10-CM

## 2017-10-21 NOTE — Telephone Encounter (Signed)
Wants refills for migraine.

## 2017-10-21 NOTE — Telephone Encounter (Signed)
Medically unable to refill/ exceeded limit /unablr e to refill until Saturday stiil not more than 8 doses per 30 day period

## 2017-10-22 MED ORDER — SUMATRIPTAN 20 MG/ACT NA SOLN: NASAL | 0 refills | Status: DC

## 2017-10-22 NOTE — Telephone Encounter (Signed)
Left message

## 2018-06-30 ENCOUNTER — Ambulatory Visit
Admission: RE | Admit: 2018-06-30 | Discharge: 2018-06-30 | Disposition: A | Discharge status: Home / Self Care | Payer: BC Managed Care – PPO | Attending: Family Medicine | Admitting: Family Medicine

## 2018-06-30 ENCOUNTER — Other Ambulatory Visit: Payer: Self-pay

## 2018-06-30 ENCOUNTER — Ambulatory Visit (INDEPENDENT_AMBULATORY_CARE_PROVIDER_SITE_OTHER): Payer: BC Managed Care – PPO | Admitting: Family Medicine

## 2018-06-30 ENCOUNTER — Ambulatory Visit
Admission: RE | Admit: 2018-06-30 | Discharge: 2018-06-30 | Disposition: A | Discharge status: Home / Self Care | Payer: BC Managed Care – PPO | Source: Ambulatory Visit | Attending: Family Medicine | Admitting: Family Medicine

## 2018-06-30 ENCOUNTER — Encounter: Payer: Self-pay | Admitting: Family Medicine

## 2018-06-30 VITALS — BP 120/80 | HR 80 | Temp 98.6°F | Ht 73.0 in | Wt 231.0 lb

## 2018-06-30 DIAGNOSIS — J0100 Acute maxillary sinusitis, unspecified: Secondary | ICD-10-CM | POA: Diagnosis not present

## 2018-06-30 DIAGNOSIS — R05 Cough: Secondary | ICD-10-CM

## 2018-06-30 DIAGNOSIS — J4521 Mild intermittent asthma with (acute) exacerbation: Secondary | ICD-10-CM

## 2018-06-30 DIAGNOSIS — R059 Cough, unspecified: Secondary | ICD-10-CM

## 2018-06-30 MED ORDER — ALBUTEROL SULFATE HFA 108 (90 BASE) MCG/ACT IN AERS: 2.0000 | INHALATION_SPRAY | Freq: Four times a day (QID) | RESPIRATORY_TRACT | 2 refills | Status: DC | PRN

## 2018-06-30 MED ORDER — AZITHROMYCIN 250 MG PO TABS
ORAL_TABLET | ORAL | 0 refills | Status: DC
Start: 2018-06-30 — End: 2019-08-17

## 2018-06-30 NOTE — Patient Instructions (Signed)
How to Use a Metered Dose Inhaler  A metered dose inhaler is a handheld device for taking medicine that must be breathed into the lungs (inhaled). The device can be used to deliver a variety of inhaled medicines, including:   Quick relief or rescue medicines, such as bronchodilators.   Controller medicines, such as corticosteroids.  The medicine is delivered by pushing down on a metal canister to release a preset amount of spray and medicine. Each device contains the amount of medicine that is needed for a preset number of uses (inhalations).  Your health care provider may recommend that you use a spacer with your inhaler to help you take the medicine more effectively. A spacer is a plastic tube with a mouthpiece on one end and an opening that connects to the inhaler on the other end. A spacer holds the medicine in a tube for a short time, which allows you to inhale more medicine.  What are the risks?  If you do not use your inhaler correctly, medicine might not reach your lungs to help you breathe.  Inhaler medicine can cause side effects, such as:   Mouth or throat infection.   Cough.   Hoarseness.   Headache.   Nausea and vomiting.   Lung infection (pneumonia) in people who have a lung condition called COPD.  How to use a metered dose inhaler without a spacer    1. Remove the cap from the inhaler.  2. If you are using the inhaler for the first time, shake it for 5 seconds, turn it away from your face, then release 4 puffs into the air. This is called priming.  3. Shake the inhaler for 5 seconds.  4. Position the inhaler so the top of the canister faces up.  5. Put your index finger on the top of the medicine canister. Support the bottom of the inhaler with your thumb.  6. Breathe out normally and as completely as possible, away from the inhaler.  7. Either place the inhaler between your teeth and close your lips tightly around the mouthpiece, or hold the inhaler 1-2 inches (2.5-5 cm) away from your open  mouth. Keep your tongue down out of the way. If you are unsure which technique to use, ask your health care provider.  8. Press the canister down with your index finger to release the medicine, then inhale deeply and slowly through your mouth (not your nose) until your lungs are completely filled. Inhaling should take 4-6 seconds.  9. Hold the medicine in your lungs for 5-10 seconds (10 seconds is best). This helps the medicine get into the small airways of your lungs.  10. With your lips in a tight circle (pursed), breathe out slowly.  11. Repeat steps 3-10 until you have taken the number of puffs that your health care provider directed. Wait about 1 minute between puffs or as directed.  12. Put the cap on the inhaler.  13. If you are using a steroid inhaler, rinse your mouth with water, gargle, and spit out the water. Do not swallow the water.  How to use a metered dose inhaler with a spacer    1. Remove the cap from the inhaler.  2. If you are using the inhaler for the first time, shake it for 5 seconds, turn it away from your face, then release 4 puffs into the air. This is called priming.  3. Shake the inhaler for 5 seconds.  4. Place the open end of the   spacer onto the inhaler mouthpiece.  5. Position the inhaler so the top of the canister faces up and the spacer mouthpiece faces you.  6. Put your index finger on the top of the medicine canister. Support the bottom of the inhaler and the spacer with your thumb.  7. Breathe out normally and as completely as possible, away from the spacer.  8. Place the spacer between your teeth and close your lips tightly around it. Keep your tongue down out of the way.  9. Press the canister down with your index finger to release the medicine, then inhale deeply and slowly through your mouth (not your nose) until your lungs are completely filled. Inhaling should take 4-6 seconds.  10. Hold the medicine in your lungs for 5-10 seconds (10 seconds is best). This helps the  medicine get into the small airways of your lungs.  11. With your lips in a tight circle (pursed), breathe out slowly.  12. Repeat steps 3-11 until you have taken the number of puffs that your health care provider directed. Wait about 1 minute between puffs or as directed.  13. Remove the spacer from the inhaler and put the cap on the inhaler.  14. If you are using a steroid inhaler, rinse your mouth with water, gargle, and spit out the water. Do not swallow the water.  Follow these instructions at home:   Take your inhaled medicine only as told by your health care provider. Do not use the inhaler more than directed by your health care provider.   Keep all follow-up visits as told by your health care provider. This is important.   If your inhaler has a counter, you can check it to determine how full your inhaler is. If your inhaler does not have a counter, ask your health care provider when you will need to refill your inhaler and write the refill date on a calendar or on your inhaler canister. Note that you cannot know when an inhaler is empty by shaking it.   Follow directions on the package insert for care and cleaning of your inhaler and spacer.  Contact a health care provider if:   Symptoms are only partially relieved with your inhaler.   You are having trouble using your inhaler.   You have an increase in phlegm.   You have headaches.  Get help right away if:   You feel little or no relief after using your inhaler.   You have dizziness.   You have a fast heart rate.   You have chills or a fever.   You have night sweats.   There is blood in your phlegm.  Summary   A metered dose inhaler is a handheld device for taking medicine that must be breathed into the lungs (inhaled).   The medicine is delivered by pushing down on a metal canister to release a preset amount of spray and medicine.   Each device contains the amount of medicine that is needed for a preset number of uses (inhalations).  This  information is not intended to replace advice given to you by your health care provider. Make sure you discuss any questions you have with your health care provider.  Document Released: 03/30/2005 Document Revised: 10/19/2016 Document Reviewed: 02/18/2016  Elsevier Interactive Patient Education  2019 Elsevier Inc.

## 2018-06-30 NOTE — Progress Notes (Signed)
Date:  06/30/2018   Name:  Ricardo Daniels   DOB:  1970/08/29   MRN:  [id number]   Chief Complaint: Sinusitis (cough, cong in chest, no fever- taking Alkaseltzer cold and flu- not helping)  Sinusitis  This is a new problem. The current episode started in the past 7 days. The problem has been gradually improving since onset. There has been no fever. The pain is mild. Associated symptoms include congestion, coughing and sinus pressure. Pertinent negatives include no chills, diaphoresis, ear pain, headaches, hoarse voice, neck pain, shortness of breath, sneezing or sore throat. Past treatments include acetaminophen and oral decongestants. The treatment provided mild relief.    Review of Systems  Constitutional: Negative for chills, diaphoresis and fever.  HENT: Positive for congestion and sinus pressure. Negative for drooling, ear discharge, ear pain, hoarse voice, sneezing and sore throat.   Respiratory: Positive for cough. Negative for chest tightness, shortness of breath and wheezing.   Cardiovascular: Negative for chest pain, palpitations and leg swelling.  Gastrointestinal: Negative for abdominal pain, blood in stool, constipation, diarrhea and nausea.  Endocrine: Negative for polydipsia.  Genitourinary: Negative for dysuria, frequency, hematuria and urgency.  Musculoskeletal: Negative for back pain, myalgias and neck pain.  Skin: Negative for rash.  Allergic/Immunologic: Negative for environmental allergies.  Neurological: Negative for dizziness and headaches.  Hematological: Does not bruise/bleed easily.  Psychiatric/Behavioral: Negative for suicidal ideas. The patient is not nervous/anxious.     There are no active problems to display for this patient.   No Known Allergies  Past Surgical History:  Procedure Laterality Date  . BACK SURGERY      Social History   Tobacco Use  . Smoking status: Current Some Day Smoker  . Smokeless tobacco: Never Used  Substance Use Topics   . Alcohol use: No    Alcohol/week: 0.0 standard drinks  . Drug use: No     Medication list has been reviewed and updated.  No outpatient medications have been marked as taking for the 06/30/18 encounter (Office Visit) with Duanne Limerick, MD.    [facility] 2/9 Scores 07/30/2017 07/30/2017 04/26/2015  PHQ - 2 Score 0 0 0  PHQ- 9 Score 0 - -    Physical Exam Vitals signs and nursing note reviewed.  HENT:     Head: Normocephalic.     Right Ear: External ear normal.     Left Ear: External ear normal.     Nose: Nose normal.  Eyes:     General: No scleral icterus.       Right eye: No discharge.        Left eye: No discharge.     Conjunctiva/sclera: Conjunctivae normal.     Pupils: Pupils are equal, round, and reactive to light.  Neck:     Musculoskeletal: Normal range of motion and neck supple.     Thyroid: No thyromegaly.     Vascular: No JVD.     Trachea: No tracheal deviation.  Cardiovascular:     Rate and Rhythm: Normal rate and regular rhythm.     Heart sounds: Normal heart sounds. No murmur. No friction rub. No gallop.   Pulmonary:     Effort: No respiratory distress.     Breath sounds: No decreased air movement or transmitted upper airway sounds. Examination of the right-upper field reveals wheezing. Examination of the left-upper field reveals wheezing. Examination of the right-middle field reveals decreased breath sounds, wheezing and rales. Examination of the left-middle field reveals wheezing.  Examination of the right-lower field reveals wheezing. Examination of the left-lower field reveals wheezing. Decreased breath sounds, wheezing and rales present. No rhonchi.    Abdominal:     General: Bowel sounds are normal.     Palpations: Abdomen is soft. There is no mass.     Tenderness: There is no abdominal tenderness. There is no guarding or rebound.  Musculoskeletal: Normal range of motion.        General: No tenderness.  Lymphadenopathy:     Cervical: No cervical adenopathy.   Skin:    General: Skin is warm.     Findings: No rash.  Neurological:     Mental Status: He is alert and oriented to person, place, and time.     Cranial Nerves: No cranial nerve deficit.     Deep Tendon Reflexes: Reflexes are normal and symmetric.     Wt Readings from Last 3 Encounters:  06/30/18 231 lb (104.8 kg)  10/19/17 217 lb (98.4 kg)  07/30/17 218 lb (98.9 kg)    BP 120/80   Pulse 80   Temp 98.6 F (37 C) (Oral)   Ht 6\' 1"  (1.854 m)   Wt 231 lb (104.8 kg)   BMI 30.48 kg/m   Assessment and Plan:  1. Cough Patient has had a persistent cough that is been nonproductive for days.  There is use wheezing throughout the lung fields consistent with reactive airway disease with decreased breath sounds in the right middle lobe.  Pulse ox is at 96%.  Patient was placed on azithromycin 250 mg 2 today followed by 1 a day for 4 days. - azithromycin (ZITHROMAX) 250 MG tablet; 2 today then 1 a day for 4 days  Dispense: 6 tablet; Refill: 0 - DG Chest 2 View; Future  2. Mild intermittent reactive airway disease with acute exacerbation Patient has diffuse wheezing patient has been instructed to quit smoking and he was given albuterol inhaler 1 to 2 puffs every 6 hours. - azithromycin (ZITHROMAX) 250 MG tablet; 2 today then 1 a day for 4 days  Dispense: 6 tablet; Refill: 0 - albuterol (PROVENTIL HFA;VENTOLIN HFA) 108 (90 Base) MCG/ACT inhaler; Inhale 2 puffs into the lungs every 6 (six) hours as needed for wheezing or shortness of breath.  Dispense: 1 Inhaler; Refill: 2 - DG Chest 2 View; Future  3. Acute maxillary sinusitis, recurrence not specified Patient does have some tenderness of the maxillary sinuses and will cover with azithromycin for possible allergic sinusitis. - azithromycin (ZITHROMAX) 250 MG tablet; 2 today then 1 a day for 4 days  Dispense: 6 tablet; Refill: 0

## 2019-08-17 ENCOUNTER — Other Ambulatory Visit: Payer: Self-pay

## 2019-08-17 ENCOUNTER — Encounter: Payer: Self-pay | Admitting: Family Medicine

## 2019-08-17 ENCOUNTER — Ambulatory Visit: Payer: BC Managed Care – PPO | Admitting: Family Medicine

## 2019-08-17 VITALS — BP 130/80 | HR 80 | Ht 73.0 in | Wt 217.0 lb

## 2019-08-17 DIAGNOSIS — J0100 Acute maxillary sinusitis, unspecified: Secondary | ICD-10-CM

## 2019-08-17 MED ORDER — AZITHROMYCIN 250 MG PO TABS: ORAL_TABLET | ORAL | 1 refills | Status: DC

## 2019-08-17 NOTE — Progress Notes (Signed)
Date:  08/17/2019   Name:  Ricardo Daniels   DOB:  Sep 03, 1970   MRN:  [id number]   Chief Complaint: Sinusitis (drainage, pressure, coughing some stuff up)  Sinusitis This is a new problem. The current episode started in the past 7 days. The problem has been waxing and waning since onset. There has been no fever. The pain is mild. Associated symptoms include congestion and sneezing. Pertinent negatives include no chills, coughing, diaphoresis, ear pain, headaches, hoarse voice, neck pain, shortness of breath, sinus pressure, sore throat or swollen glands. Past treatments include spray decongestants. The treatment provided mild relief.    Lab Results  Component Value Date   CREATININE 1.16 04/26/2015   BUN 17 04/26/2015   NA 141 04/26/2015   K 4.5 04/26/2015   CL 103 04/26/2015   CO2 25 04/26/2015   Lab Results  Component Value Date   CHOL 158 04/26/2015   HDL 39 (L) 04/26/2015   LDLCALC 107 (H) 04/26/2015   TRIG 59 04/26/2015   CHOLHDL 4.1 04/26/2015   No results found for: TSH No results found for: HGBA1C No results found for: WBC, HGB, HCT, MCV, PLT No results found for: ALT, AST, GGT, ALKPHOS, BILITOT   Review of Systems  Constitutional: Negative for chills, diaphoresis and fever.  HENT: Positive for congestion, postnasal drip, rhinorrhea and sneezing. Negative for drooling, ear discharge, ear pain, hoarse voice, sinus pressure and sore throat.   Respiratory: Negative for cough, shortness of breath and wheezing.   Cardiovascular: Negative for chest pain, palpitations and leg swelling.  Gastrointestinal: Negative for abdominal pain, blood in stool, constipation, diarrhea and nausea.  Endocrine: Negative for polydipsia.  Genitourinary: Negative for dysuria, frequency, hematuria and urgency.  Musculoskeletal: Negative for back pain, myalgias and neck pain.  Skin: Negative for rash.  Allergic/Immunologic: Negative for environmental allergies.  Neurological: Negative for  dizziness and headaches.  Hematological: Does not bruise/bleed easily.  Psychiatric/Behavioral: Negative for suicidal ideas. The patient is not nervous/anxious.     There are no problems to display for this patient.   No Known Allergies  Past Surgical History:  Procedure Laterality Date  . BACK SURGERY      Social History   Tobacco Use  . Smoking status: Current Some Day Smoker  . Smokeless tobacco: Never Used  Substance Use Topics  . Alcohol use: No    Alcohol/week: 0.0 standard drinks  . Drug use: No     Medication list has been reviewed and updated.  Current Meds  Medication Sig  . SUMAtriptan (IMITREX) 20 MG/ACT nasal spray May repeat x 1 if headache persists with Max dose of 40 mg per day.    PHQ 2/9 Scores 08/17/2019 07/30/2017 07/30/2017 04/26/2015  PHQ - 2 Score 0 0 0 0  PHQ- 9 Score 0 0 - -    BP Readings from Last 3 Encounters:  08/17/19 130/80  06/30/18 120/80  10/19/17 118/82    Physical Exam Vitals and nursing note reviewed.  HENT:     Head: Normocephalic.     Right Ear: Tympanic membrane, ear canal and external ear normal.     Left Ear: Tympanic membrane, ear canal and external ear normal.     Nose: Nose normal. No congestion or rhinorrhea.     Mouth/Throat:     Mouth: Mucous membranes are moist.  Eyes:     General: No scleral icterus.       Right eye: No discharge.  Left eye: No discharge.     Conjunctiva/sclera: Conjunctivae normal.     Pupils: Pupils are equal, round, and reactive to light.  Neck:     Thyroid: No thyromegaly.     Vascular: No JVD.     Trachea: No tracheal deviation.  Cardiovascular:     Rate and Rhythm: Normal rate and regular rhythm.     Heart sounds: Normal heart sounds. No murmur. No friction rub. No gallop.   Pulmonary:     Effort: No respiratory distress.     Breath sounds: Normal breath sounds. No wheezing, rhonchi or rales.  Abdominal:     General: Bowel sounds are normal.     Palpations: Abdomen is  soft. There is no mass.     Tenderness: There is no abdominal tenderness. There is no guarding or rebound.  Musculoskeletal:        General: No tenderness. Normal range of motion.     Cervical back: Normal range of motion and neck supple.  Lymphadenopathy:     Cervical: No cervical adenopathy.  Skin:    General: Skin is warm.     Findings: No rash.  Neurological:     Mental Status: He is alert and oriented to person, place, and time.     Cranial Nerves: No cranial nerve deficit.     Deep Tendon Reflexes: Reflexes are normal and symmetric.     Wt Readings from Last 3 Encounters:  08/17/19 217 lb (98.4 kg)  06/30/18 231 lb (104.8 kg)  10/19/17 217 lb (98.4 kg)    BP 130/80   Pulse 80   Ht 6\' 1"  (1.854 m)   Wt 217 lb (98.4 kg)   BMI 28.63 kg/m   Assessment and Plan: 1. Acute maxillary sinusitis, recurrence not specified Acute.  Persistent.  Stable.  Uncontrolled.  Patient has exam and history consistent with an acute maxillary sinusitis with tenderness over both maxillary sinuses.  We will initiate azithromycin 250 mg 2 today followed by 1 a day for 4 days.  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  Patient is also going to continue over-the-counter nonsedating antihistamine as well.

## 2021-06-26 ENCOUNTER — Other Ambulatory Visit: Payer: Self-pay

## 2021-06-26 ENCOUNTER — Ambulatory Visit
Admission: RE | Admit: 2021-06-26 | Discharge: 2021-06-26 | Disposition: A | Discharge status: Home / Self Care | Payer: BC Managed Care – PPO | Attending: Family Medicine | Admitting: Family Medicine

## 2021-06-26 ENCOUNTER — Encounter: Payer: BC Managed Care – PPO | Admitting: Family Medicine

## 2021-06-26 ENCOUNTER — Encounter: Payer: Self-pay | Admitting: Family Medicine

## 2021-06-26 ENCOUNTER — Ambulatory Visit
Admission: RE | Admit: 2021-06-26 | Discharge: 2021-06-26 | Disposition: A | Discharge status: Home / Self Care | Payer: BC Managed Care – PPO | Source: Ambulatory Visit | Attending: Family Medicine | Admitting: Family Medicine

## 2021-06-26 ENCOUNTER — Ambulatory Visit: Payer: BC Managed Care – PPO | Admitting: Family Medicine

## 2021-06-26 VITALS — BP 118/72 | HR 72 | Ht 73.0 in | Wt 229.4 lb

## 2021-06-26 DIAGNOSIS — M7541 Impingement syndrome of right shoulder: Secondary | ICD-10-CM | POA: Diagnosis present

## 2021-06-26 DIAGNOSIS — M5412 Radiculopathy, cervical region: Secondary | ICD-10-CM | POA: Insufficient documentation

## 2021-06-26 MED ORDER — MELOXICAM 15 MG PO TABS: 15.0000 mg | ORAL_TABLET | Freq: Every day | ORAL | 1 refills | Status: DC

## 2021-06-26 NOTE — Assessment & Plan Note (Signed)
Right-hand-dominant patient presenting with atraumatic right superolateral shoulder pain ongoing for the past few weeks.  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  Denies any specific onset other than noting the symptoms during that timeframe.  Pain occurs sporadically, aggravated with overhead motion, has noted associated paresthesias in the right upper extremity past the elbow to the hand/fingers.  Uncertain about nighttime pain/symptoms as he has noted recent worsened migraines preceding these events.  He has dosed BC powder as needed with limited response.  He denies any similar symptoms in the past. ? ?Examination today reveals full painful range of motion primarily with overhead localizing to the superior shoulder, resisted rotator cuff testing reveals 5/5 strength in all directions with pain during resisted internal rotation, positive Neer's and positive Hawkins, equivocal O'Brien's, negative speeds and Yergason's, cross body benign.  He is tender along the levator scapula, upper trapezius, and rhomboids on the right, he does have a positive Spurling's test on the right. ? ?Clinical history and features are most consistent with both cervical radiculopathy on the right as well as rotator cuff related impingement with focality to the subscapularis.  I have advised patient of the same as well as most likely there being a primary with secondary/compensatory involvement of the other structure. ? ?Plan for dedicated cervical spine and right shoulder x-rays, initiation of scheduled meloxicam, supportive care, relative rest from activity standpoint, and close follow-up in 2 weeks time for reevaluation.  If suboptimal response noted, can consider escalation to oral prednisone, subacromial corticosteroid ultrasound-guided injections.  If adequately improved, wean from medications, formal versus home-based  rehab to commence. ?

## 2021-06-26 NOTE — Progress Notes (Signed)
?  ? ?Primary Care / Sports Medicine Office Visit ? ?Patient Information:  ?Patient ID: Ricardo Daniels, male DOB: 05-09-1970 Age: 51 y.o. MRN: [id number]  ? ?Ricardo Daniels is a pleasant 51 y.o. male presenting with the following: ? ?Chief Complaint  ?Patient presents with  ? Shoulder Pain  ?  Right, for 1-2 weeks, no known injury, feels like pain is in joint. No medicaiton  ? ? ?Vitals:  ? 06/26/21 0813  ?BP: 118/72  ?Pulse: 72  ?SpO2: 98%  ? ?Vitals:  ? 06/26/21 0813  ?Weight: 229 lb 6.4 oz (104.1 kg)  ?Height: 6\' 1"  (1.854 m)  ? ?Body mass index is 30.27 kg/m?. ? ?No results found.  ? ?Independent interpretation of notes and tests performed by another provider:  ? ?None ? ?Procedures performed:  ? ?None ? ?Pertinent History, Exam, Impression, and Recommendations:  ? ?Rotator cuff impingement syndrome of right shoulder ?Right-hand-dominant patient presenting with atraumatic right superolateral shoulder pain ongoing for the past few weeks.  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  Denies any specific onset other than noting the symptoms during that timeframe.  Pain occurs sporadically, aggravated with overhead motion, has noted associated paresthesias in the right upper extremity past the elbow to the hand/fingers.  Uncertain about nighttime pain/symptoms as he has noted recent worsened migraines preceding these events.  He has dosed BC powder as needed with limited response.  He denies any similar symptoms in the past. ? ?Examination today reveals full painful range of motion primarily with overhead localizing to the superior shoulder, resisted rotator cuff testing reveals 5/5 strength in all directions with pain during resisted internal rotation, positive Neer's and positive Hawkins, equivocal O'Brien's, negative speeds and Yergason's, cross body benign.  He is tender along the levator scapula, upper  trapezius, and rhomboids on the right, he does have a positive Spurling's test on the right. ? ?Clinical history and features are most consistent with both cervical radiculopathy on the right as well as rotator cuff related impingement with focality to the subscapularis.  I have advised patient of the same as well as most likely there being a primary with secondary/compensatory involvement of the other structure. ? ?Plan for dedicated cervical spine and right shoulder x-rays, initiation of scheduled meloxicam, supportive care, relative rest from activity standpoint, and close follow-up in 2 weeks time for reevaluation.  If suboptimal response noted, can consider escalation to oral prednisone, subacromial corticosteroid ultrasound-guided injections.  If adequately improved, wean from medications, formal versus home-based rehab to commence. ? ?Right cervical radiculopathy ?See additional assessment(s) for plan details.  Right cervical radicular features noted over the past few weeks in the setting of right shoulder pain, can be considered primary/secondary given his clinical course. ? ?Plan for dedicated cervical spine x-rays, initiation of meloxicam, supportive care, relative rest from a physical activity standpoint, and close follow-up in 2 weeks time. ?  ? ?Orders & Medications ?Meds ordered this encounter  ?Medications  ? meloxicam (MOBIC) 15 MG tablet  ?  Sig: Take 1 tablet (15 mg total) by mouth daily.  ?  Dispense:  14 tablet  ?  Refill:  1  ? ?Orders Placed This Encounter  ?Procedures  ? DG Cervical Spine Complete  ? DG Shoulder Right  ?  ? ?Return in about 2 weeks (around 07/10/2021).  ?  ? ?Montel Culver, MD ? ? Primary  Care Sports Medicine ?[facility] ?[place]  ? ?

## 2021-06-26 NOTE — Assessment & Plan Note (Signed)
See additional assessment(s) for plan details.  Right cervical radicular features noted over the past few weeks in the setting of right shoulder pain, can be considered primary/secondary given his clinical course. ? ?Plan for dedicated cervical spine x-rays, initiation of meloxicam, supportive care, relative rest from a physical activity standpoint, and close follow-up in 2 weeks time. ? ?

## 2021-06-26 NOTE — Patient Instructions (Signed)
Findings are consistent with aggravation of the right rotator cuff and neck with right-sided nerve pinch symptoms ? ?- Obtain x-rays today ?- Start meloxicam once daily, take with food (no other NSAIDs while this medication) ?- Restrict activity to simple daily tasks (no strenuous activity until follow-up ?- Can utilize ice, heat for additional symptom control ?- Return for follow-up in 2 weeks ?- Contact us for any questions between now and then ?

## 2021-06-30 NOTE — Progress Notes (Signed)
PC to pt, dicussed results, voiced understanding.

## 2021-08-14 ENCOUNTER — Encounter: Payer: Self-pay | Admitting: Family Medicine

## 2021-08-14 ENCOUNTER — Ambulatory Visit: Payer: BC Managed Care – PPO | Admitting: Family Medicine

## 2021-08-14 VITALS — BP 138/88 | HR 60 | Temp 97.4°F | Ht 73.0 in | Wt 226.0 lb

## 2021-08-14 DIAGNOSIS — J0100 Acute maxillary sinusitis, unspecified: Secondary | ICD-10-CM | POA: Diagnosis not present

## 2021-08-14 MED ORDER — AMOXICILLIN 500 MG PO CAPS: 500.0000 mg | ORAL_CAPSULE | Freq: Three times a day (TID) | ORAL | 0 refills | Status: DC

## 2021-08-14 NOTE — Progress Notes (Signed)
? ? ?Date:  08/14/2021  ? ?Name:  Ricardo Daniels   DOB:  02-10-1971   MRN:  [id number] ? ? ?Chief Complaint: No chief complaint on file. ? ?Sinusitis ?This is a new problem. The current episode started in the past 7 days (7-10 days). The problem has been waxing and waning since onset. There has been no fever. The pain is mild. Associated symptoms include congestion, headaches and sinus pressure. Pertinent negatives include no chills, coughing, ear pain, neck pain, shortness of breath, sneezing or sore throat.  ? ?Lab Results  ?Component Value Date  ? NA 141 04/26/2015  ? K 4.5 04/26/2015  ? CO2 25 04/26/2015  ? GLUCOSE 88 04/26/2015  ? BUN 17 04/26/2015  ? CREATININE 1.16 04/26/2015  ? CALCIUM 9.6 04/26/2015  ? GFRNONAA 75 04/26/2015  ? ?Lab Results  ?Component Value Date  ? CHOL 158 04/26/2015  ? HDL 39 (L) 04/26/2015  ? LDLCALC 107 (H) 04/26/2015  ? TRIG 59 04/26/2015  ? CHOLHDL 4.1 04/26/2015  ? ?No results found for: TSH ?No results found for: HGBA1C ?No results found for: WBC, HGB, HCT, MCV, PLT ?No results found for: ALT, AST, GGT, ALKPHOS, BILITOT ?No results found for: 25OHVITD2, 25OHVITD3, VD25OH  ? ?Review of Systems  ?Constitutional:  Negative for chills and fever.  ?HENT:  Positive for congestion and sinus pressure. Negative for drooling, ear discharge, ear pain, sneezing and sore throat.   ?Respiratory:  Negative for cough, shortness of breath and wheezing.   ?Cardiovascular:  Negative for chest pain, palpitations and leg swelling.  ?Gastrointestinal:  Negative for abdominal pain, blood in stool, constipation, diarrhea and nausea.  ?Endocrine: Negative for polydipsia.  ?Genitourinary:  Negative for dysuria, frequency, hematuria and urgency.  ?Musculoskeletal:  Negative for back pain, myalgias and neck pain.  ?Skin:  Negative for rash.  ?Allergic/Immunologic: Negative for environmental allergies.  ?Neurological:  Positive for headaches. Negative for dizziness.  ?Hematological:  Does not bruise/bleed easily.   ?Psychiatric/Behavioral:  Negative for suicidal ideas. The patient is not nervous/anxious.   ? ?Patient Active Problem List  ? Diagnosis Date Noted  ? Right cervical radiculopathy 06/26/2021  ? Rotator cuff impingement syndrome of right shoulder 06/26/2021  ? ? ?No Known Allergies ? ?Past Surgical History:  ?Procedure Laterality Date  ? BACK SURGERY    ? ? ?Social History  ? ?Tobacco Use  ? Smoking status: Some Days  ? Smokeless tobacco: Never  ?Vaping Use  ? Vaping Use: Never used  ?Substance Use Topics  ? Alcohol use: No  ?  Alcohol/week: 0.0 standard drinks  ? Drug use: No  ? ? ? ?Medication list has been reviewed and updated. ? ?Current Meds  ?Medication Sig  ? meloxicam (MOBIC) 15 MG tablet Take 1 tablet (15 mg total) by mouth daily.  ? ? ? ?  08/14/2021  ? 11:50 AM 06/26/2021  ?  8:25 AM 08/17/2019  ?  2:02 PM  ?GAD 7 : Generalized Anxiety Score  ?Nervous, Anxious, on Edge 0 0 0  ?Control/stop worrying 0 0 0  ?Worry too much - different things 0 0 0  ?Trouble relaxing 0 0 0  ?Restless 0 0 0  ?Easily annoyed or irritable 0 0 0  ?Afraid - awful might happen 0 0 0  ?Total GAD 7 Score 0 0 0  ?Anxiety Difficulty Not difficult at all Not difficult at all   ? ? ? ?  08/14/2021  ? 11:50 AM  ?Depression screen PHQ 2/9  ?  Decreased Interest 0  ?Down, Depressed, Hopeless 0  ?PHQ - 2 Score 0  ?Altered sleeping 0  ?Tired, decreased energy 0  ?Change in appetite 0  ?Feeling bad or failure about yourself  0  ?Trouble concentrating 0  ?Moving slowly or fidgety/restless 0  ?Suicidal thoughts 0  ?PHQ-9 Score 0  ?Difficult doing work/chores Not difficult at all  ? ? ?BP Readings from Last 3 Encounters:  ?08/14/21 (!) 150/80  ?06/26/21 118/72  ?08/17/19 130/80  ? ? ?Physical Exam ?Vitals and nursing note reviewed.  ?HENT:  ?   Head: Normocephalic.  ?   Right Ear: Tympanic membrane and external ear normal. There is no impacted cerumen.  ?   Left Ear: Tympanic membrane and external ear normal. There is no impacted cerumen.  ?   Nose: Nose  normal. No congestion or rhinorrhea.  ?   Right Turbinates: Not swollen.  ?   Left Turbinates: Not swollen.  ?   Right Sinus: No maxillary sinus tenderness or frontal sinus tenderness.  ?   Left Sinus: No maxillary sinus tenderness or frontal sinus tenderness.  ?Eyes:  ?   General: No scleral icterus.    ?   Right eye: No discharge.     ?   Left eye: No discharge.  ?   Conjunctiva/sclera: Conjunctivae normal.  ?   Pupils: Pupils are equal, round, and reactive to light.  ?Neck:  ?   Thyroid: No thyromegaly.  ?   Vascular: No JVD.  ?   Trachea: No tracheal deviation.  ?Cardiovascular:  ?   Rate and Rhythm: Normal rate and regular rhythm.  ?   Heart sounds: Normal heart sounds. No murmur heard. ?  No friction rub. No gallop.  ?Pulmonary:  ?   Effort: No respiratory distress.  ?   Breath sounds: Normal breath sounds. No wheezing, rhonchi or rales.  ?Abdominal:  ?   General: Bowel sounds are normal.  ?   Palpations: Abdomen is soft. There is no mass.  ?   Tenderness: There is no abdominal tenderness. There is no guarding or rebound.  ?Musculoskeletal:     ?   General: No tenderness.  ?   Cervical back: Normal range of motion and neck supple.  ?Lymphadenopathy:  ?   Cervical: No cervical adenopathy.  ?Neurological:  ?   Mental Status: He is alert.  ?   Deep Tendon Reflexes: Reflexes are normal and symmetric.  ? ? ?Wt Readings from Last 3 Encounters:  ?08/14/21 226 lb (102.5 kg)  ?06/26/21 229 lb 6.4 oz (104.1 kg)  ?08/17/19 217 lb (98.4 kg)  ? ? ?BP (!) 150/80   Pulse 60   Temp (!) 97.4 ?F (36.3 ?C) (Oral)   Ht 6\' 1"  (1.854 m)   Wt 226 lb (102.5 kg)   BMI 29.82 kg/m?  ? ?Assessment and plan ? ?1. Acute maxillary sinusitis, recurrence not specified ?New onset.  Persistent.  Stable.  Exam and history is consistent with sinus infection.  Patient also has an upcoming wedding that he needs to be noninfectious and stable.  We will initiate amoxicillin 500 mg 3 times a day for 10 days. ? ?

## 2021-08-14 NOTE — Progress Notes (Deleted)
? ? ?  Date:  08/14/2021  ? ?Name:  Ricardo Daniels   DOB:  Sep 06, 1970   MRN:  [id number] ? ? ?Chief Complaint: No chief complaint on file. ? ?HPI ? ?Lab Results  ?Component Value Date  ? NA 141 04/26/2015  ? K 4.5 04/26/2015  ? CO2 25 04/26/2015  ? GLUCOSE 88 04/26/2015  ? BUN 17 04/26/2015  ? CREATININE 1.16 04/26/2015  ? CALCIUM 9.6 04/26/2015  ? GFRNONAA 75 04/26/2015  ? ?Lab Results  ?Component Value Date  ? CHOL 158 04/26/2015  ? HDL 39 (L) 04/26/2015  ? LDLCALC 107 (H) 04/26/2015  ? TRIG 59 04/26/2015  ? CHOLHDL 4.1 04/26/2015  ? ?No results found for: TSH ?No results found for: HGBA1C ?No results found for: WBC, HGB, HCT, MCV, PLT ?No results found for: ALT, AST, GGT, ALKPHOS, BILITOT ?No results found for: 25OHVITD2, [place], VD25OH  ? ?Review of Systems ? ?Patient Active Problem List  ? Diagnosis Date Noted  ? Right cervical radiculopathy 06/26/2021  ? Rotator cuff impingement syndrome of right shoulder 06/26/2021  ? ? ?No Known Allergies ? ?Past Surgical History:  ?Procedure Laterality Date  ? BACK SURGERY    ? ? ?Social History  ? ?Tobacco Use  ? Smoking status: Some Days  ? Smokeless tobacco: Never  ?Vaping Use  ? Vaping Use: Never used  ?Substance Use Topics  ? Alcohol use: No  ?  Alcohol/week: 0.0 standard drinks  ? Drug use: No  ? ? ? ?Medication list has been reviewed and updated. ? ?Current Meds  ?Medication Sig  ? meloxicam (MOBIC) 15 MG tablet Take 1 tablet (15 mg total) by mouth daily.  ? ? ? ?  06/26/2021  ?  8:25 AM 08/17/2019  ?  2:02 PM  ?GAD 7 : Generalized Anxiety Score  ?Nervous, Anxious, on Edge 0 0  ?Control/stop worrying 0 0  ?Worry too much - different things 0 0  ?Trouble relaxing 0 0  ?Restless 0 0  ?Easily annoyed or irritable 0 0  ?Afraid - awful might happen 0 0  ?Total GAD 7 Score 0 0  ?Anxiety Difficulty Not difficult at all   ? ? ? ?  06/26/2021  ?  8:25 AM  ?Depression screen PHQ 2/9  ?Decreased Interest 0  ?Down, Depressed, Hopeless 0  ?PHQ - 2 Score 0  ?Altered sleeping 0  ?Tired,  decreased energy 0  ?Change in appetite 0  ?Feeling bad or failure about yourself  0  ?Trouble concentrating 0  ?Moving slowly or fidgety/restless 0  ?Suicidal thoughts 0  ?PHQ-9 Score 0  ? ? ?BP Readings from Last 3 Encounters:  ?08/14/21 (!) 150/80  ?06/26/21 118/72  ?08/17/19 130/80  ? ? ?Physical Exam ? ?Wt Readings from Last 3 Encounters:  ?08/14/21 226 lb (102.5 kg)  ?06/26/21 229 lb 6.4 oz (104.1 kg)  ?08/17/19 217 lb (98.4 kg)  ? ? ?BP (!) 150/80   Pulse 60   Temp (!) 97.4 ?F (36.3 ?C) (Oral)   Ht 6\' 1"  (1.854 m)   Wt 226 lb (102.5 kg)   BMI 29.82 kg/m?  ? ?Assessment and Plan: ? ? ? ? ?

## 2021-09-25 ENCOUNTER — Encounter: Payer: Self-pay | Admitting: Family Medicine

## 2021-09-25 ENCOUNTER — Telehealth: Payer: Self-pay | Admitting: Family Medicine

## 2021-09-25 ENCOUNTER — Ambulatory Visit: Payer: BC Managed Care – PPO | Admitting: Family Medicine

## 2021-09-25 VITALS — BP 120/80 | HR 84 | Ht 73.0 in | Wt 221.0 lb

## 2021-09-25 DIAGNOSIS — J0100 Acute maxillary sinusitis, unspecified: Secondary | ICD-10-CM | POA: Diagnosis not present

## 2021-09-25 DIAGNOSIS — R051 Acute cough: Secondary | ICD-10-CM | POA: Diagnosis not present

## 2021-09-25 DIAGNOSIS — Z1211 Encounter for screening for malignant neoplasm of colon: Secondary | ICD-10-CM

## 2021-09-25 DIAGNOSIS — Z23 Encounter for immunization: Secondary | ICD-10-CM

## 2021-09-25 MED ORDER — GUAIFENESIN-CODEINE 100-10 MG/5ML PO SYRP: 5.0000 mL | ORAL_SOLUTION | Freq: Three times a day (TID) | ORAL | 0 refills | Status: DC | PRN

## 2021-09-25 MED ORDER — AMOXICILLIN 500 MG PO CAPS: 500.0000 mg | ORAL_CAPSULE | Freq: Three times a day (TID) | ORAL | 0 refills | Status: DC

## 2021-09-25 NOTE — Progress Notes (Signed)
Date:  09/25/2021   Name:  Ricardo Daniels   DOB:  1970-12-12   MRN:  [id number]   Chief Complaint: Sinusitis (Cough and cong- green production, pressure in head) and tdap  Sinusitis This is a new problem. The current episode started in the past 7 days. The problem has been gradually worsening since onset. There has been no fever. Associated symptoms include congestion, coughing, headaches, a hoarse voice and sinus pressure. Pertinent negatives include no ear pain, shortness of breath or sore throat. Past treatments include oral decongestants. The treatment provided mild relief.  Cough This is a new problem. The current episode started in the past 7 days. The problem has been waxing and waning. The cough is Productive of purulent sputum (yellow/green). Associated symptoms include headaches and postnasal drip. Pertinent negatives include no chest pain, ear congestion, ear pain, heartburn, myalgias, rhinorrhea, sore throat or shortness of breath. Risk factors for lung disease include smoking/tobacco exposure.    Lab Results  Component Value Date   NA 141 04/26/2015   K 4.5 04/26/2015   CO2 25 04/26/2015   GLUCOSE 88 04/26/2015   BUN 17 04/26/2015   CREATININE 1.16 04/26/2015   CALCIUM 9.6 04/26/2015   GFRNONAA 75 04/26/2015   Lab Results  Component Value Date   CHOL 158 04/26/2015   HDL 39 (L) 04/26/2015   LDLCALC 107 (H) 04/26/2015   TRIG 59 04/26/2015   CHOLHDL 4.1 04/26/2015   No results found for: "TSH" No results found for: "HGBA1C" No results found for: "WBC", "HGB", "HCT", "MCV", "PLT" No results found for: "ALT", "AST", "GGT", "ALKPHOS", "BILITOT" No results found for: "25OHVITD2", "25OHVITD3", "VD25OH"   Review of Systems  HENT:  Positive for congestion, hoarse voice, postnasal drip and sinus pressure. Negative for ear pain, rhinorrhea and sore throat.   Respiratory:  Positive for cough. Negative for shortness of breath.   Cardiovascular:  Negative for chest pain.   Gastrointestinal:  Negative for heartburn.  Musculoskeletal:  Negative for myalgias.  Neurological:  Positive for headaches.    Patient Active Problem List   Diagnosis Date Noted   Right cervical radiculopathy 06/26/2021   Rotator cuff impingement syndrome of right shoulder 06/26/2021    No Known Allergies  Past Surgical History:  Procedure Laterality Date   BACK SURGERY      Social History   Tobacco Use   Smoking status: Some Days   Smokeless tobacco: Never  Vaping Use   Vaping Use: Never used  Substance Use Topics   Alcohol use: No    Alcohol/week: 0.0 standard drinks of alcohol   Drug use: No     Medication list has been reviewed and updated.  No outpatient medications have been marked as taking for the 09/25/21 encounter (Office Visit) with Duanne Limerick, MD.       09/25/2021   10:49 AM 09/25/2021   10:43 AM 08/14/2021   11:50 AM 06/26/2021    8:25 AM  GAD 7 : Generalized Anxiety Score  Nervous, Anxious, on Edge 0 0 0 0  Control/stop worrying 0 0 0 0  Worry too much - different things 0 0 0 0  Trouble relaxing 0 0 0 0  Restless 0 0 0 0  Easily annoyed or irritable 0 0 0 0  Afraid - awful might happen 0 0 0 0  Total GAD 7 Score 0 0 0 0  Anxiety Difficulty Not difficult at all Not difficult at all Not difficult at all Not  difficult at all       09/25/2021   10:44 AM  Depression screen PHQ 2/9  Decreased Interest 0  Down, Depressed, Hopeless 0  PHQ - 2 Score 0  Altered sleeping 0  Tired, decreased energy 0  Change in appetite 0  Feeling bad or failure about yourself  0  Trouble concentrating 0  Moving slowly or fidgety/restless 0  Suicidal thoughts 0  PHQ-9 Score 0  Difficult doing work/chores Not difficult at all    BP Readings from Last 3 Encounters:  09/25/21 120/80  08/14/21 138/88  06/26/21 118/72    Physical Exam Vitals and nursing note reviewed.  HENT:     Head: Normocephalic.     Right Ear: Tympanic membrane, ear canal and  external ear normal.     Left Ear: Tympanic membrane, ear canal and external ear normal.     Nose: Nose normal. No congestion or rhinorrhea.  Eyes:     General: No scleral icterus.       Right eye: No discharge.        Left eye: No discharge.     Conjunctiva/sclera: Conjunctivae normal.     Pupils: Pupils are equal, round, and reactive to light.  Neck:     Thyroid: No thyromegaly.     Vascular: No JVD.     Trachea: No tracheal deviation.  Cardiovascular:     Rate and Rhythm: Normal rate and regular rhythm.     Heart sounds: Normal heart sounds. No murmur heard.    No friction rub. No gallop.  Pulmonary:     Effort: No respiratory distress.     Breath sounds: Normal breath sounds. No wheezing, rhonchi or rales.  Abdominal:     General: Bowel sounds are normal.     Palpations: Abdomen is soft. There is no mass.     Tenderness: There is no abdominal tenderness. There is no guarding or rebound.  Musculoskeletal:        General: No tenderness. Normal range of motion.     Cervical back: Normal range of motion and neck supple.  Lymphadenopathy:     Cervical: No cervical adenopathy.  Skin:    General: Skin is warm.     Findings: No rash.  Neurological:     Mental Status: He is alert and oriented to person, place, and time.     Cranial Nerves: No cranial nerve deficit.     Deep Tendon Reflexes: Reflexes are normal and symmetric.     Wt Readings from Last 3 Encounters:  09/25/21 221 lb (100.2 kg)  08/14/21 226 lb (102.5 kg)  06/26/21 229 lb 6.4 oz (104.1 kg)    BP 120/80   Pulse 84   Ht 6\' 1"  (1.854 m)   Wt 221 lb (100.2 kg)   BMI 29.16 kg/m   Assessment and Plan:  1. Acute maxillary sinusitis, recurrence not specified Acute.  Persistent.  Stable.  Patient has frontal headache with tightness and congestion in the maxillary.  History and examination is consistent with sinusitis and will treat with amoxicillin 500 mg 3 times a day for 10 days. - amoxicillin (AMOXIL) 500 MG  capsule; Take 1 capsule (500 mg total) by mouth 3 (three) times daily for 10 days.  Dispense: 30 capsule; Refill: 0  2. Acute cough New onset.  Patient has been encouraged to use Mucinex DM during the day and has been given guaifenesin with codeine primarily at night or when he is at home not working.  Last chest x-ray was in 2020 and given history of smoking we will get a chest x-ray to see what current pulmonary conditions. - DG Chest 2 View - guaiFENesin-codeine (ROBITUSSIN AC) 100-10 MG/5ML syrup; Take 5 mLs by mouth 3 (three) times daily as needed for cough.  Dispense: 118 mL; Refill: 0  3. Colon cancer screening Discussed with patient referral placed with gastroenterology for colonoscopy. - Ambulatory referral to Gastroenterology

## 2021-09-25 NOTE — Telephone Encounter (Signed)
Copied from CRM [id number]. Topic: General - Inquiry >> Sep 25, 2021  8:14 AM Pincus Sanes wrote: Amy, pt wife needs to speak with Delice Bison before husband's appt at 10:40 if possible [phone]

## 2021-09-28 ENCOUNTER — Ambulatory Visit: Payer: BC Managed Care – PPO

## 2021-09-29 ENCOUNTER — Telehealth: Payer: Self-pay | Admitting: Family Medicine

## 2021-09-29 ENCOUNTER — Other Ambulatory Visit: Payer: Self-pay

## 2021-09-29 ENCOUNTER — Telehealth: Payer: Self-pay

## 2021-09-29 ENCOUNTER — Ambulatory Visit
Admission: RE | Admit: 2021-09-29 | Discharge: 2021-09-29 | Disposition: A | Discharge status: Home / Self Care | Payer: BC Managed Care – PPO | Attending: Family Medicine | Admitting: Family Medicine

## 2021-09-29 ENCOUNTER — Ambulatory Visit
Admission: RE | Admit: 2021-09-29 | Discharge: 2021-09-29 | Disposition: A | Discharge status: Home / Self Care | Payer: BC Managed Care – PPO | Source: Ambulatory Visit | Attending: Family Medicine | Admitting: Family Medicine

## 2021-09-29 DIAGNOSIS — R051 Acute cough: Secondary | ICD-10-CM | POA: Diagnosis not present

## 2021-09-29 DIAGNOSIS — Z1211 Encounter for screening for malignant neoplasm of colon: Secondary | ICD-10-CM

## 2021-09-29 MED ORDER — NA SULFATE-K SULFATE-MG SULF 17.5-3.13-1.6 GM/177ML PO SOLN: 1.0000 | Freq: Once | ORAL | 0 refills | Status: AC

## 2021-09-29 NOTE — Telephone Encounter (Signed)
Copied from CRM [id number]. Topic: General - Inquiry >> Sep 29, 2021  8:30 AM Pincus Sanes wrote: Reason for CRM: Pls have Delice Bison call pt no better at all from 6/15 in fact in lungs now, Chest xRay? [phone]

## 2021-09-29 NOTE — Telephone Encounter (Signed)
CALLED PATIENT NO ANSWER LEFT VOICEMAIL FOR A CALL BACK ? ?

## 2021-09-29 NOTE — Progress Notes (Signed)
Gastroenterology Pre-Procedure Review  Request Date: 02/20/2022 Requesting Physician: Dr. Tobi Bastos   PATIENT REVIEW QUESTIONS: The patient responded to the following health history questions as indicated:    1. Are you having any GI issues? no 2. Do you have a personal history of Polyps? no 3. Do you have a family history of Colon Cancer or Polyps? no 4. Diabetes Mellitus? no 5. Joint replacements in the past 12 months?no 6. Major health problems in the past 3 months?no 7. Any artificial heart valves, MVP, or defibrillator?no    MEDICATIONS & ALLERGIES:    Patient reports the following regarding taking any anticoagulation/antiplatelet therapy:   Plavix, Coumadin, Eliquis, Xarelto, Lovenox, Pradaxa, Brilinta, or Effient? no Aspirin? no  Patient confirms/reports the following medications:  Current Outpatient Medications  Medication Sig Dispense Refill   amoxicillin (AMOXIL) 500 MG capsule Take 1 capsule (500 mg total) by mouth 3 (three) times daily for 10 days. 30 capsule 0   guaiFENesin-codeine (ROBITUSSIN AC) 100-10 MG/5ML syrup Take 5 mLs by mouth 3 (three) times daily as needed for cough. 118 mL 0   meloxicam (MOBIC) 15 MG tablet Take 1 tablet (15 mg total) by mouth daily. 14 tablet 1   No current facility-administered medications for this visit.    Patient confirms/reports the following allergies:  No Known Allergies  No orders of the defined types were placed in this encounter.   AUTHORIZATION INFORMATION Primary Insurance: 1D#: Group #:  Secondary Insurance: 1D#: Group #:  SCHEDULE INFORMATION: Date: 02/20/2022 Time: Location:[facility]

## 2021-10-08 ENCOUNTER — Other Ambulatory Visit: Payer: Self-pay

## 2021-10-08 ENCOUNTER — Ambulatory Visit: Payer: Self-pay | Admitting: *Deleted

## 2021-10-08 DIAGNOSIS — J0100 Acute maxillary sinusitis, unspecified: Secondary | ICD-10-CM

## 2021-10-08 MED ORDER — AZITHROMYCIN 250 MG PO TABS: ORAL_TABLET | ORAL | 0 refills | Status: AC

## 2021-10-08 NOTE — Progress Notes (Signed)
Sent in ZPack to [facility]

## 2021-10-08 NOTE — Telephone Encounter (Signed)
Summary: another antibiotic wanted   Pt was seen a little over a week ago for sinus infection / wife is calling saying they think he needs another antibiotic / the first RX didn't help with the infection much / please advise       Chief Complaint: requesting additional antibiotics, something different to get rid of sinus issues  Symptoms: sx better but not gone from previous visit  Frequency: na Pertinent Negatives: Patient denies chest pain, no difficulty breathing no fever. Disposition: [] ED /[] Urgent Care (no appt availability in office) / [] Appointment(In office/virtual)/ []  [facility]/ [] Home Care/ [] Refused Recommended Disposition /[] [facility]/ [x]  Follow-up with PCP Additional Notes:   Requesting another antibiotic to completely get rid of sinus sx.  Last seen 09/25/21 and completed course of amoxicillin. Patient will be going out of town on 10/10/21 for work and requesting medication before he leaves.       Reason for Disposition  Prescription request for new medicine (not a refill)  Answer Assessment - Initial Assessment Questions 1. NAME of MEDICATION: "What medicine are you calling about?"     amoxicillin 2. QUESTION: "What is your question?" (e.g., double dose of medicine, side effect)     Can "I" get another antibiotic for sinus issues? Completed course of amoxicillin and need something more or different to get rid of symptoms  3. PRESCRIBING HCP: "Who prescribed it?" Reason: if prescribed by specialist, call should be referred to that group.     PCP 4. SYMPTOMS: "Do you have any symptoms?"     Yes  5. SEVERITY: If symptoms are present, ask "Are they mild, moderate or severe?"     Better but not gone  6. PREGNANCY:  "Is there any chance that you are pregnant?" "When was your last menstrual period?"     na  Protocols used: Medication Question Call-A-AH

## 2021-11-04 ENCOUNTER — Ambulatory Visit (INDEPENDENT_AMBULATORY_CARE_PROVIDER_SITE_OTHER): Payer: BC Managed Care – PPO | Admitting: Family Medicine

## 2021-11-04 ENCOUNTER — Encounter: Payer: Self-pay | Admitting: Family Medicine

## 2021-11-04 VITALS — BP 136/70 | HR 80 | Ht 73.0 in | Wt 221.0 lb

## 2021-11-04 DIAGNOSIS — Z0000 Encounter for general adult medical examination without abnormal findings: Secondary | ICD-10-CM

## 2021-11-04 LAB — HEMOCCULT GUIAC POC 1CARD (OFFICE): Fecal Occult Blood, POC: NEGATIVE

## 2021-11-04 NOTE — Progress Notes (Signed)
Date:  11/04/2021   Name:  Daniels Daniels   DOB:  1970/04/29   MRN:  [id number]   Chief Complaint: Annual Exam  Patient is a 51 year old male who presents for a comprehensive physical exam. The patient reports the following problems: none. Health maintenance has been reviewed up to date.Daniels Daniels is a 51 y.o. male who presents today for his Complete Annual Exam. He feels well. He reports exercising Daniels Daniels. He reports he is sleeping well.        Lab Results  Component Value Date   NA 141 04/26/2015   K 4.5 04/26/2015   CO2 25 04/26/2015   GLUCOSE 88 04/26/2015   BUN 17 04/26/2015   CREATININE 1.16 04/26/2015   CALCIUM 9.6 04/26/2015   GFRNONAA 75 04/26/2015   Lab Results  Component Value Date   CHOL 158 04/26/2015   HDL 39 (L) 04/26/2015   LDLCALC 107 (H) 04/26/2015   TRIG 59 04/26/2015   CHOLHDL 4.1 04/26/2015   No results found for: "TSH" No results found for: "HGBA1C" No results found for: "WBC", "HGB", "HCT", "MCV", "PLT" No results found for: "ALT", "AST", "GGT", "ALKPHOS", "BILITOT" No results found for: "25OHVITD2", "25OHVITD3", "VD25OH"   Review of Systems  Constitutional:  Negative for chills and fever.  HENT:  Negative for drooling, ear discharge, ear pain and sore throat.   Respiratory:  Negative for cough, shortness of breath and wheezing.   Cardiovascular:  Negative for chest pain, palpitations and leg swelling.  Gastrointestinal:  Negative for abdominal pain, blood in stool, constipation, diarrhea and nausea.  Endocrine: Negative for polydipsia.  Genitourinary:  Negative for dysuria, frequency, hematuria and urgency.  Musculoskeletal:  Negative for back pain, myalgias and neck pain.  Skin:  Negative for rash.  Allergic/Immunologic: Negative for environmental allergies.  Neurological:  Negative for dizziness and headaches.  Hematological:  Does not bruise/bleed easily.  Psychiatric/Behavioral:  Negative for suicidal ideas. The patient is not  nervous/anxious.     Patient Active Problem List   Diagnosis Date Noted   Right cervical radiculopathy 06/26/2021   Rotator cuff impingement syndrome of right shoulder 06/26/2021    No Known Allergies  Past Surgical History:  Procedure Laterality Date   BACK SURGERY      Social History   Tobacco Use   Smoking status: Some Days   Smokeless tobacco: Never  Vaping Use   Vaping Use: Never used  Substance Use Topics   Alcohol use: No    Alcohol/week: 0.0 standard drinks of alcohol   Drug use: No     Medication list has been reviewed and updated.  No outpatient medications have been marked as taking for the 11/04/21 encounter (Office Visit) with Duanne Limerick, MD.       11/04/2021    8:54 AM 09/25/2021   10:49 AM 09/25/2021   10:43 AM 08/14/2021   11:50 AM  GAD 7 : Generalized Anxiety Score  Nervous, Anxious, on Edge 0 0 0 0  Control/stop worrying 0 0 0 0  Worry too much - different things 0 0 0 0  Trouble relaxing 0 0 0 0  Restless 0 0 0 0  Easily annoyed or irritable 0 0 0 0  Afraid - awful might happen 0 0 0 0  Total GAD 7 Score 0 0 0 0  Anxiety Difficulty Not difficult at all Not difficult at all Not difficult at all Not difficult at all  11/04/2021    8:54 AM 09/25/2021   10:44 AM 09/25/2021   10:43 AM  Depression screen PHQ 2/9  Decreased Interest 0 0 0  Down, Depressed, Hopeless 0 0 0  PHQ - 2 Score 0 0 0  Altered sleeping 0 0 0  Tired, decreased energy 0 0 2  Change in appetite 0 0 0  Feeling bad or failure about yourself  0 0 0  Trouble concentrating 0 0 0  Moving slowly or fidgety/restless 0 0 0  Suicidal thoughts 0 0 0  PHQ-9 Score 0 0 2  Difficult doing work/chores Not difficult at all Not difficult at all Not difficult at all    BP Readings from Last 3 Encounters:  11/04/21 136/70  09/25/21 120/80  08/14/21 138/88    Physical Exam Vitals and nursing note reviewed.  Constitutional:      Appearance: He is well-groomed and  overweight.  HENT:     Head: Normocephalic.     Right Ear: Hearing, tympanic membrane, ear canal and external ear normal.     Left Ear: Hearing, tympanic membrane, ear canal and external ear normal.     Nose: Nose normal.     Mouth/Throat:     Lips: Pink.     Mouth: Mucous membranes are moist.     Dentition: Normal dentition.     Tongue: No lesions.     Pharynx: Oropharynx is clear.  Eyes:     General: Lids are normal. Vision grossly intact. Gaze aligned appropriately. No scleral icterus.       Right eye: No discharge.        Left eye: No discharge.     Extraocular Movements: Extraocular movements intact.     Conjunctiva/sclera: Conjunctivae normal.     Pupils: Pupils are equal, round, and reactive to light.  Neck:     Thyroid: No thyroid mass, thyromegaly or thyroid tenderness.     Vascular: Normal carotid pulses. No carotid bruit, hepatojugular reflux or JVD.     Trachea: Trachea and phonation normal. No tracheal deviation.  Cardiovascular:     Rate and Rhythm: Normal rate and regular rhythm.     Pulses: Normal pulses.          Carotid pulses are 2+ on the right side and 2+ on the left side.      Radial pulses are 2+ on the right side and 2+ on the left side.       Femoral pulses are 2+ on the right side and 2+ on the left side.      Popliteal pulses are 2+ on the right side and 2+ on the left side.       Dorsalis pedis pulses are 2+ on the right side and 2+ on the left side.       Posterior tibial pulses are 2+ on the right side and 2+ on the left side.     Heart sounds: Normal heart sounds and S1 normal. No murmur heard.    No systolic murmur is present.     No diastolic murmur is present.     No friction rub. No gallop. No S3 or S4 sounds.  Pulmonary:     Effort: Pulmonary effort is normal. No respiratory distress.     Breath sounds: Normal breath sounds. No decreased breath sounds, wheezing, rhonchi or rales.  Chest:  Breasts:    Right: Normal.     Left: Normal.   Abdominal:     General: Bowel sounds  are normal.     Palpations: Abdomen is soft. There is no hepatomegaly, splenomegaly or mass.     Tenderness: There is no abdominal tenderness. There is no guarding or rebound.  Genitourinary:    Penis: Normal.      Testes: Normal.        Right: Mass not present.        Left: Mass not present.     Prostate: Normal. Not enlarged, not tender and no nodules present.     Rectum: Normal. Guaiac result negative. No mass.  Musculoskeletal:        General: No tenderness. Normal range of motion.     Cervical back: Full passive range of motion without pain, normal range of motion and neck supple.     Right lower leg: No edema.     Left lower leg: No edema.  Lymphadenopathy:     Head:     Right side of head: No submental, submandibular or tonsillar adenopathy.     Left side of head: No submental, submandibular or tonsillar adenopathy.     Cervical: No cervical adenopathy.     Right cervical: No superficial, deep or posterior cervical adenopathy.    Left cervical: No superficial, deep or posterior cervical adenopathy.     Upper Body:     Right upper body: No supraclavicular or axillary adenopathy.     Left upper body: No supraclavicular or axillary adenopathy.  Skin:    General: Skin is warm.     Capillary Refill: Capillary refill takes less than 2 seconds.     Findings: No rash.  Neurological:     Mental Status: He is alert and oriented to person, place, and time.     Cranial Nerves: Cranial nerves 2-12 are intact. No cranial nerve deficit.     Sensory: Sensation is intact. No sensory deficit.     Motor: Motor function is intact.     Deep Tendon Reflexes: Reflexes are normal and symmetric. Reflexes normal.     Reflex Scores:      Tricep reflexes are 2+ on the right side and 2+ on the left side.      Bicep reflexes are 2+ on the right side and 2+ on the left side.      Brachioradialis reflexes are 2+ on the right side and 2+ on the left side.       Patellar reflexes are 2+ on the right side and 2+ on the left side.      Achilles reflexes are 2+ on the right side and 2+ on the left side. Psychiatric:        Behavior: Behavior is cooperative.     Wt Readings from Last 3 Encounters:  11/04/21 221 lb (100.2 kg)  09/25/21 221 lb (100.2 kg)  08/14/21 226 lb (102.5 kg)    BP 136/70   Pulse 80   Ht 6\' 1"  (1.854 m)   Wt 221 lb (100.2 kg)   BMI 29.16 kg/m   Assessment and Plan:  1. Annual physical exam Immunizations are reviewed and recommendations provided.   Age appropriate screening tests are discussed. Counseling given for risk factor reduction interventions.  No subjective/objective concerns noted during HPI, review of systems, nor physical exam.  We will obtain baseline labs including lipid panel CMP CBC and PSA.  Smoking cessation was discussed with patient. - POCT Occult Blood Stool - Lipid Panel With LDL/HDL Ratio - Comprehensive Metabolic Panel (CMET) - CBC with Differential/Platelet - PSA

## 2021-11-05 LAB — CBC WITH DIFFERENTIAL/PLATELET
Basophils Absolute: 0.1 10*3/uL (ref 0.0–0.2)
Basos: 1 %
EOS (ABSOLUTE): 0.2 10*3/uL (ref 0.0–0.4)
Eos: 2 %
Hematocrit: 42.4 % (ref 37.5–51.0)
Hemoglobin: 14 g/dL (ref 13.0–17.7)
Immature Grans (Abs): 0 10*3/uL (ref 0.0–0.1)
Immature Granulocytes: 0 %
Lymphocytes Absolute: 2.3 10*3/uL (ref 0.7–3.1)
Lymphs: 27 %
MCH: 29.5 pg (ref 26.6–33.0)
MCHC: 33 g/dL (ref 31.5–35.7)
MCV: 90 fL (ref 79–97)
Monocytes Absolute: 0.6 10*3/uL (ref 0.1–0.9)
Monocytes: 6 %
Neutrophils Absolute: 5.5 10*3/uL (ref 1.4–7.0)
Neutrophils: 64 %
Platelets: 242 10*3/uL (ref 150–450)
RBC: 4.74 x10E6/uL (ref 4.14–5.80)
RDW: 13.5 % (ref 11.6–15.4)
WBC: 8.6 10*3/uL (ref 3.4–10.8)

## 2021-11-05 LAB — LIPID PANEL WITH LDL/HDL RATIO
Cholesterol, Total: 162 mg/dL (ref 100–199)
HDL: 38 mg/dL — ABNORMAL LOW (ref >39)
LDL Chol Calc (NIH): 110 mg/dL — ABNORMAL HIGH (ref 0–99)
LDL/HDL Ratio: 2.9 ratio (ref 0.0–3.6)
Triglycerides: 71 mg/dL (ref 0–149)
VLDL Cholesterol Cal: 14 mg/dL (ref 5–40)

## 2021-11-05 LAB — COMPREHENSIVE METABOLIC PANEL
ALT: 15 IU/L (ref 0–44)
AST: 16 IU/L (ref 0–40)
Albumin/Globulin Ratio: 1.9 (ref 1.2–2.2)
Albumin: 4.4 g/dL (ref 3.8–4.9)
Alkaline Phosphatase: 98 IU/L (ref 44–121)
BUN/Creatinine Ratio: 15 (ref 9–20)
BUN: 19 mg/dL (ref 6–24)
Bilirubin Total: <0.2 mg/dL (ref 0.0–1.2)
CO2: 20 mmol/L (ref 20–29)
Calcium: 9.3 mg/dL (ref 8.7–10.2)
Chloride: 105 mmol/L (ref 96–106)
Creatinine, Ser: 1.24 mg/dL (ref 0.76–1.27)
Globulin, Total: 2.3 g/dL (ref 1.5–4.5)
Glucose: 91 mg/dL (ref 70–99)
Potassium: 4.2 mmol/L (ref 3.5–5.2)
Sodium: 141 mmol/L (ref 134–144)
Total Protein: 6.7 g/dL (ref 6.0–8.5)
eGFR: 70 mL/min/{1.73_m2} (ref >59)

## 2021-11-05 LAB — PSA: Prostate Specific Ag, Serum: 0.9 ng/mL (ref 0.0–4.0)

## 2022-02-16 ENCOUNTER — Telehealth: Payer: Self-pay | Admitting: *Deleted

## 2022-02-16 NOTE — Telephone Encounter (Addendum)
Patient's wife called office stating that they need to reschedule patient's colonoscopy due to conflict in their schedule.  Colonoscopy was schedule on 02/20/2022. Spoken to [facility] endo unit and made the change. It is now reschedule to 03/13/2022.  New instruction will be sent with new dates.

## 2022-02-25 NOTE — Telephone Encounter (Signed)
Patient's wife called office stating that they need to reschedule patient's colonoscopy again due daughter is getting induced on 03/13/2022 so they need to reschedule.  Voicemail message left for patient's wife to return my call.

## 2022-02-27 NOTE — Telephone Encounter (Signed)
Patient's wife called stating that they need reschedule the colonoscopy again due daughter is getting induced on 03/13/2022.  Called [facility] endo unit to make the change. Colonoscopy have been reschedule to 03/26/2022 as requested.  New instructions will be sent.

## 2022-03-24 ENCOUNTER — Telehealth: Payer: Self-pay | Admitting: *Deleted

## 2022-03-24 NOTE — Telephone Encounter (Signed)
Spoken to patient's wife (Amy) that they need to reschedule the colonoscopy again due to his patient's job 03/26/2022.  We have reschedule to 05/01/2022. Called [facility] endo unit to make the change.  New instructions will be sent. Patient's wife verbalized understanding.

## 2022-04-28 ENCOUNTER — Telehealth: Payer: Self-pay | Admitting: Gastroenterology

## 2022-04-28 NOTE — Telephone Encounter (Signed)
Message left for patient to return my call.  

## 2022-04-28 NOTE — Telephone Encounter (Signed)
Patient calling to reschedule colonoscopy. Requesting call back. 

## 2022-04-30 NOTE — Telephone Encounter (Signed)
I was able to get a hold of patient today. He stated that he have some kind of stomach bug going on and is not feeling so well, not sure if it is COVID or not, he have not checked but wanted to cancel the colonoscopy for 05/01/2022.  Patient or his wife will call at a later time to reschedule the colonoscopy when he is ready.

## 2022-05-01 ENCOUNTER — Ambulatory Visit
Admission: RE | Admit: 2022-05-01 | Payer: BC Managed Care – PPO | Source: Home / Self Care | Admitting: Gastroenterology

## 2022-05-01 ENCOUNTER — Encounter: Admission: RE | Payer: Self-pay | Source: Home / Self Care

## 2022-05-01 SURGERY — COLONOSCOPY WITH PROPOFOL
Anesthesia: General

## 2022-11-06 ENCOUNTER — Encounter: Payer: BC Managed Care – PPO | Admitting: Family Medicine

## 2023-10-28 NOTE — Progress Notes (Signed)
 Today the history is gathered from: 100% - patient  0% - alone again  REFERRING PHYSICIAN: Lane Arthea Locus, MD PRIMARY CARE PHYSICIAN:  Ricardo Cathryne Rodney, MD   IMPRESSION/PLAN  [name] is a 53 y.o. male presenting for evaluation of  CLUSTER HEADACHES Assessment & Plan Cluster headaches Cluster headaches for 20 years, occurring in cycles every 2.5 to 3 years. Current cycle ongoing for about three weeks, with headaches starting to subside. Severe headaches occur every two days, with moderate ones more frequently. Severity of current cycle is less than previous cycles, with no headaches reaching a pain level of 10. Imitrex  nasal spray used sparingly, with BC powders used for mild to moderate headaches. - Refill Imitrex  nasal spray with sufficient refills for future cycles. - Consider verapamil or Emgality as preventative treatment during the onset of a cycle. - Consider a steroid taper at the start of a cycle to calm symptoms. - Advise to limit BC powder use to avoid exceeding daily recommended dose due to potential GI and kidney issues, and the possibility of worsening headaches.   Schedule annual follow-up to manage medication refills and treatment adjustments.   CHIEF COMPLAIN & HISTORY OF PRESENT ILLNESS:  [name] is a 53 y.o. male presenting for evaluation of: Chief Complaint  Patient presents with  . Headache    CLUSTER HEADACHES History of Present Illness Ricardo Daniels is a 54 year old male with cluster headaches who presents for follow-up.  He has been experiencing cluster headaches for the past 20 years, occurring in cycles every two and a half to three years. The current cycle has been ongoing for about three weeks, with headaches starting to subside. The headaches are severe, with the most intense ones occurring every two days, and moderate ones more frequently. He had a particularly bad headache this morning.  He uses Imitrex  nasal spray as needed, preferring  to avoid it unless the headache is severe. He also takes three to four [facility] powders a day as needed during his headache cycles, which he finds helps manage the pain. He started taking vitamin B1 and magnesium two weeks ago, which he feels has helped reduce the severity of his headaches.  The headaches are extremely painful. The severe headaches can last from five to fifteen minutes. He has not experienced a headache rated as a 'ten' in severity during this cycle, with most being around a six or seven.  He has made lifestyle changes, including avoiding alcohol and drugs, and tries to manage the headaches with hydration and rest. The emotional and physical toll of the headaches is significant, often leaving him drained.  His wife tracks his headache cycles on a calendar, and the cycles have been consistent over the years, occurring every two and a half years and lasting about a month. He has tried oxygen therapy in the past, but found it more aggravating than helpful.   DATA SUMMARY: 04/28/12  CT HEAD LIMITED WO CONTRAST IMPRESSION: 1. There is no acute abnormality of the brain.  2. I do not see inflammatory change in the visualized portions of the paranasal sinuses. The orbital structures are grossly normal where visualized.   08/19/10  CT HEAD W WO CONTRAST IMPRESSION: 1. I do not see evidence of an acute ischemic or hemorrhagic event.  2. There is no intracranial mass effect nor hydrocephalus.  3. There is minimal prominence of the distal left internal carotid artery on the postcontrast image # 11. Followup MRI and MR angiography  of the brain would be useful given the patient's chronic persistent symptoms.  4. I see no evidence of inflammatory change within the visualized portions of the paranasal sinuses. No calvarial lesions are identified.   VISIT SUMMARIES: 07/24/2019: - Patient states that his headaches have been becoming a little more frequent.  Will consider Emgality monthly injections  for patient when he is in a headache cycle. Call our office if symptoms worsen. Refill Imitrex  nasal injections.  MEDICATIONS Current Outpatient Medications  Medication Sig Dispense Refill  . MAGNESIUM ORAL Take by mouth    . multivitamin tablet Take 1 tablet by mouth once daily    . SUMAtriptan  (IMITREX ) 20 mg/actuation nasal spray Take one spray at headache onset. May take a second dose after 2 hours if needed. 20 each 3   No current facility-administered medications for this visit.    ALLERGIES No Known Allergies   EXAM   Vitals:   10/28/23 1059  BP: 110/78  Weight: 99.8 kg (220 lb)  Height: 185.4 cm (6' 1)  PainSc: 0-No pain    Body mass index is 29.03 kg/m.  GENERAL: Pleasant male, no distress. Normocephalic and atraumatic.  Baseline neurological exam below was obtained at prior office visit. Changes from today's visit appear in bold.   MUSCULOSKELETAL: Bulk - Normal Tone - Normal Pronator Drift - Absent bilaterally. Ambulation - Gait and station are generally steady Romberg - not tested  R/L 5/5    Shoulder abduction (deltoid/supraspinatus, axillary/suprascapular n, C5) 5/5    Elbow flexion (biceps brachii, musculoskeletal n, C5-6) 5/5    Elbow extension (triceps, radial n, C7) 5/5    Finger adduction (interossei, ulnar n, T1)  5/5    Hip flexion (iliopsoas, L1/L2) 5/5    Knee flexion (hamstrings, sciatic n, L5/S1)  5/5    Knee extension (quadriceps, femoral n, L3/4) 5/5    Ankle dorsiflexion (tibialis anterior, deep fibular n, L4/5) 5/5    Ankle plantarflexion (gastroc, tibial n, S1)   NEUROLOGICAL: MENTAL STATUS: Patient is oriented to person, place and time.   Short-term memory is intact, today Long-term memory is intact.   Attention span and concentration are intact.   Naming and repetition are intact. Comprehension is intact.   Expressive speech is intact.   Patient's fund of knowledge is within normal limits for educational level.  CRANIAL  NERVES: Visual acuity and visual fields are intact         Extraocular muscles are intact                        Facial sensation is intact bilaterally                Facial strength is intact bilaterally                   Hearing is intact bilaterally                              Palate elevates midline, normal phonation     Shoulder shrug strength is intact                     COORDINATION/CEREBELLAR: Finger to nose testing is not tested      PAST MEDICAL HISTORY Past Medical History:  Diagnosis Date  . GERD (gastroesophageal reflux disease)   . Migraine headache 2010   Cluster    PAST SURGICAL HISTORY Past  Surgical History:  Procedure Laterality Date  . BACK SURGERY  2006   ruptures disk L5  . back surgery    . SPINE SURGERY  2001/2002?   laminectomy    FAMILY HISTORY No family history on file.  SOCIAL HISTORY  Social History   Tobacco Use  . Smoking status: Light Smoker  . Smokeless tobacco: Current  Vaping Use  . Vaping status: Never Used  Substance Use Topics  . Alcohol use: No  . Drug use: No     REVIEW OF SYSTEMS:  13 system ROS was verbally reviewed with patient. Pertinent positives and negatives are mentioned above in the HPI and all other systems are negative.   DATA   04/28/12 CT HEAD LIMITED WO CONTRAST  REASON FOR EXAM:Headaches  COMMENTS:   PROCEDURE:MCT - MCT HEAD WITHOUT CONTRAST  - Apr 28 2012 11:21AM   RESULT:Axial noncontrast CT scanning was performed through the brain  with reconstructions at 5 mm intervals and slice thicknesses. Comparison  is made to the study of Aug 19, 2010.   The ventricles are normal in size and position. There is no intracranial  hemorrhage nor intracranial mass effect. There are no abnormal  intracranial calcifications. A stable punctate basal ganglia calcification is present on the left. The cerebellum and brainstem are normal in density. There is no evidence of an evolving ischemic infarction.   At bone  window settings the frontal, ethmoid, sphenoid, and visualized  portions of the maxillary sinuses are clear. The mastoid air cells are  well pneumatized. There is no evidence of an acute skull fracture.   IMPRESSION:  1. There is no acute abnormality of the brain.  2. I do not see inflammatory change in the visualized portions of the  paranasal sinuses. The orbital structures are grossly normal where  visualized.  __________________________________________________ 08/19/10 CT HEAD W WO CONTRAST REASON FOR EXAM: headaches times five weeks which have been more  frequent and worsening  COMMENTS:   PROCEDURE: MCT - MCT HEAD W/WO  - Aug 19 2010 11:24AM   RESULT: Axial noncontrast and contrast enhanced CT scans were  performed through the brain at 5 mm intervals and slice thicknesses. For the  postcontrast images the patient received 50 cc of Isovue-300. Comparison  is made to the study of 29 September 2008.   The noncontrast images reveal the ventricles to be normal in size and  position. There is no intracranial hemorrhage nor intracranial mass  effect. There is no evidence of an evolving ischemic infarction. The cerebellum  and brainstem are normal in density.   Following contrast administration the enhancement pattern of the brain  parenchyma is normal. The observed intracranial vascularity exhibits no  definite acute abnormality. There is minimal prominence of the distal  internal carotid artery on the left on image 11.   At bone window settings the observed portions of the paranasal sinuses and  mastoid air cells are clear. I do not see evidence of an acute skull  fracture nor lytic or blastic calvarial lesion.   IMPRESSION:  1. I do not see evidence of an acute ischemic or hemorrhagic event.  2. There is no intracranial mass effect nor hydrocephalus.  3. There is minimal prominence of the distal left internal carotid artery  on  the postcontrast image # 11. Followup MRI and MR  angiography of the brain  would be useful given the patient's chronic persistent symptoms.  4. I see no evidence of inflammatory change within the  visualized portions  of the paranasal sinuses. No calvarial lesions are identified.  __________________________________________________   No visits with results within 6 Month(s) from this visit.  Latest known visit with results is:  No results found for any previous visit.   No follow-ups on file.  Payor: [employer]ETNA / Plan: [employer] PPO / Product Type: PPO /   This note is partially written by Greig Pouch, scribe, in the presence of and acting as the scribe of Lauraine Rocks, PA-C.    I agree that the scribe documentation is complete and accurate.  This note was generated in part with voice recognition software and I apologize for any typographical errors that were not detected and corrected.     Attestation Statement:   I personally performed the service, non-incident to. [facility])   SARAH ELIZABETH MASON, PA       This note has been created using automated tools and reviewed for accuracy by [facility].

## 2023-12-25 IMAGING — CR DG CHEST 2V
2 series · 2 of 2 positions shown · non-contrast
Comparison: June 1918

CLINICAL DATA: Cough and congestion

EXAM:
CHEST - 2 VIEW

[chest pa]
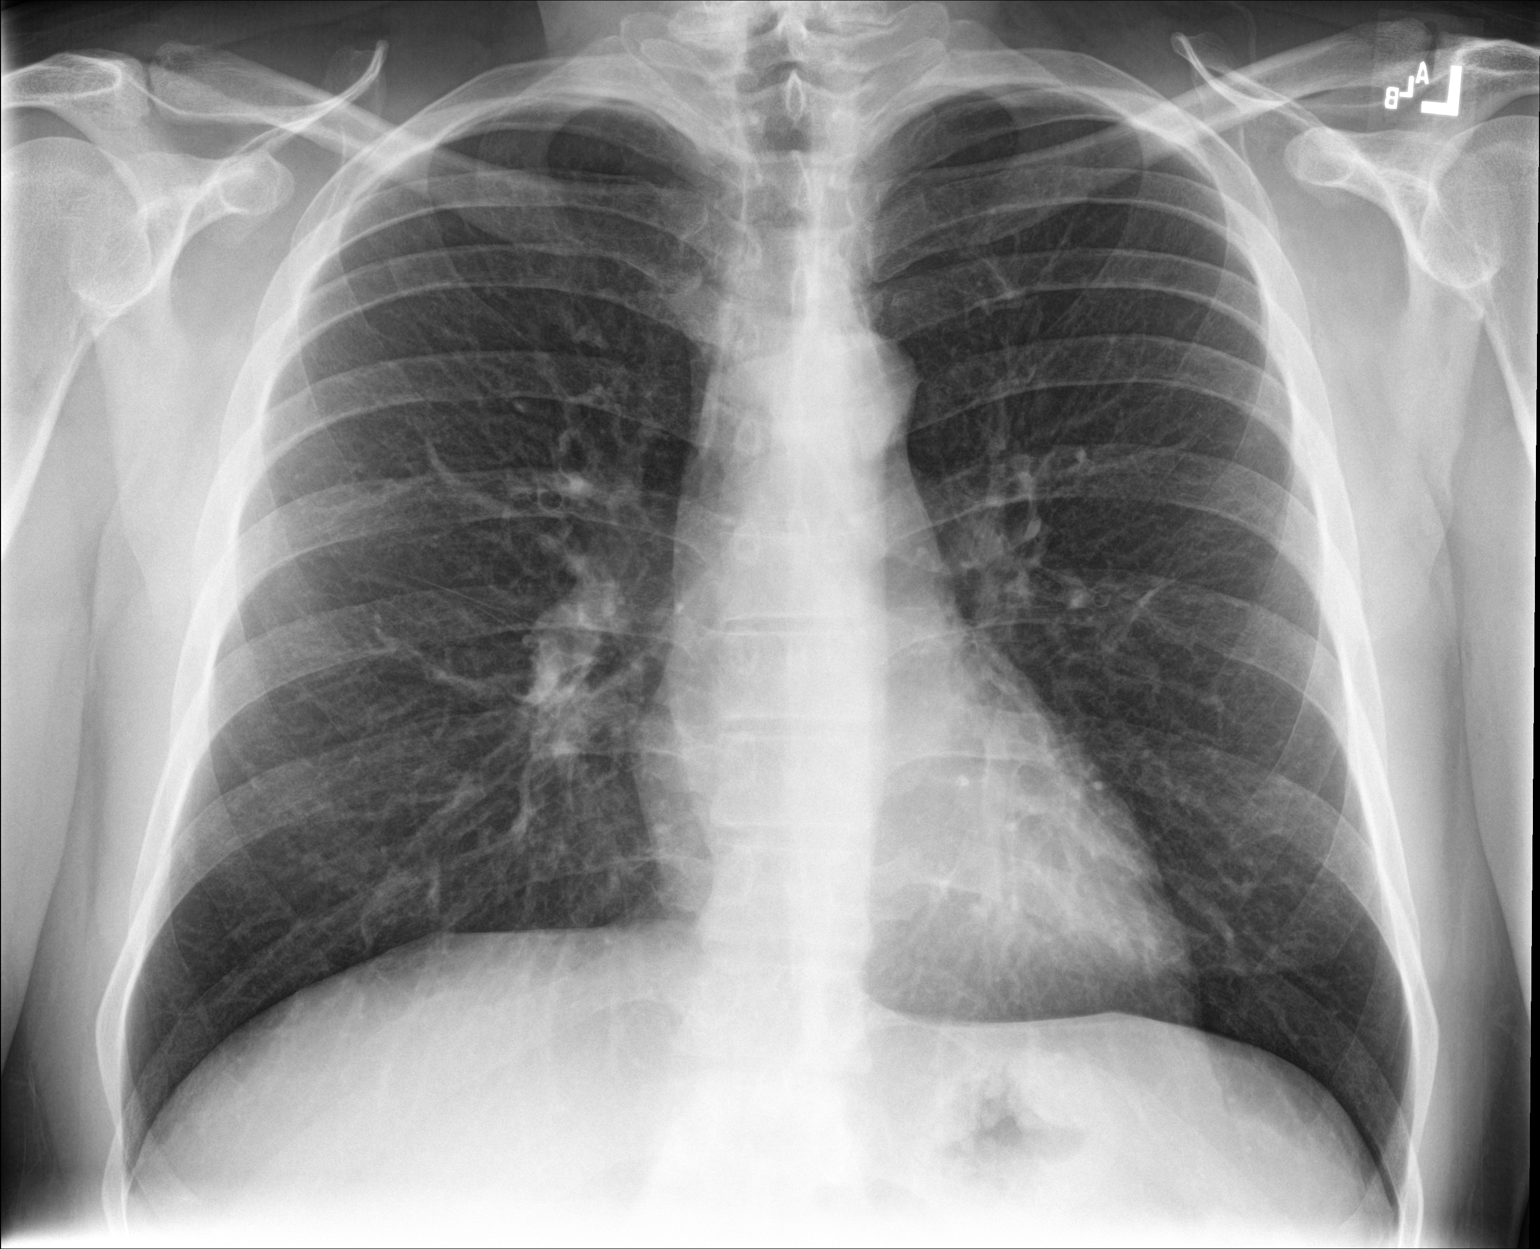

[chest lat]
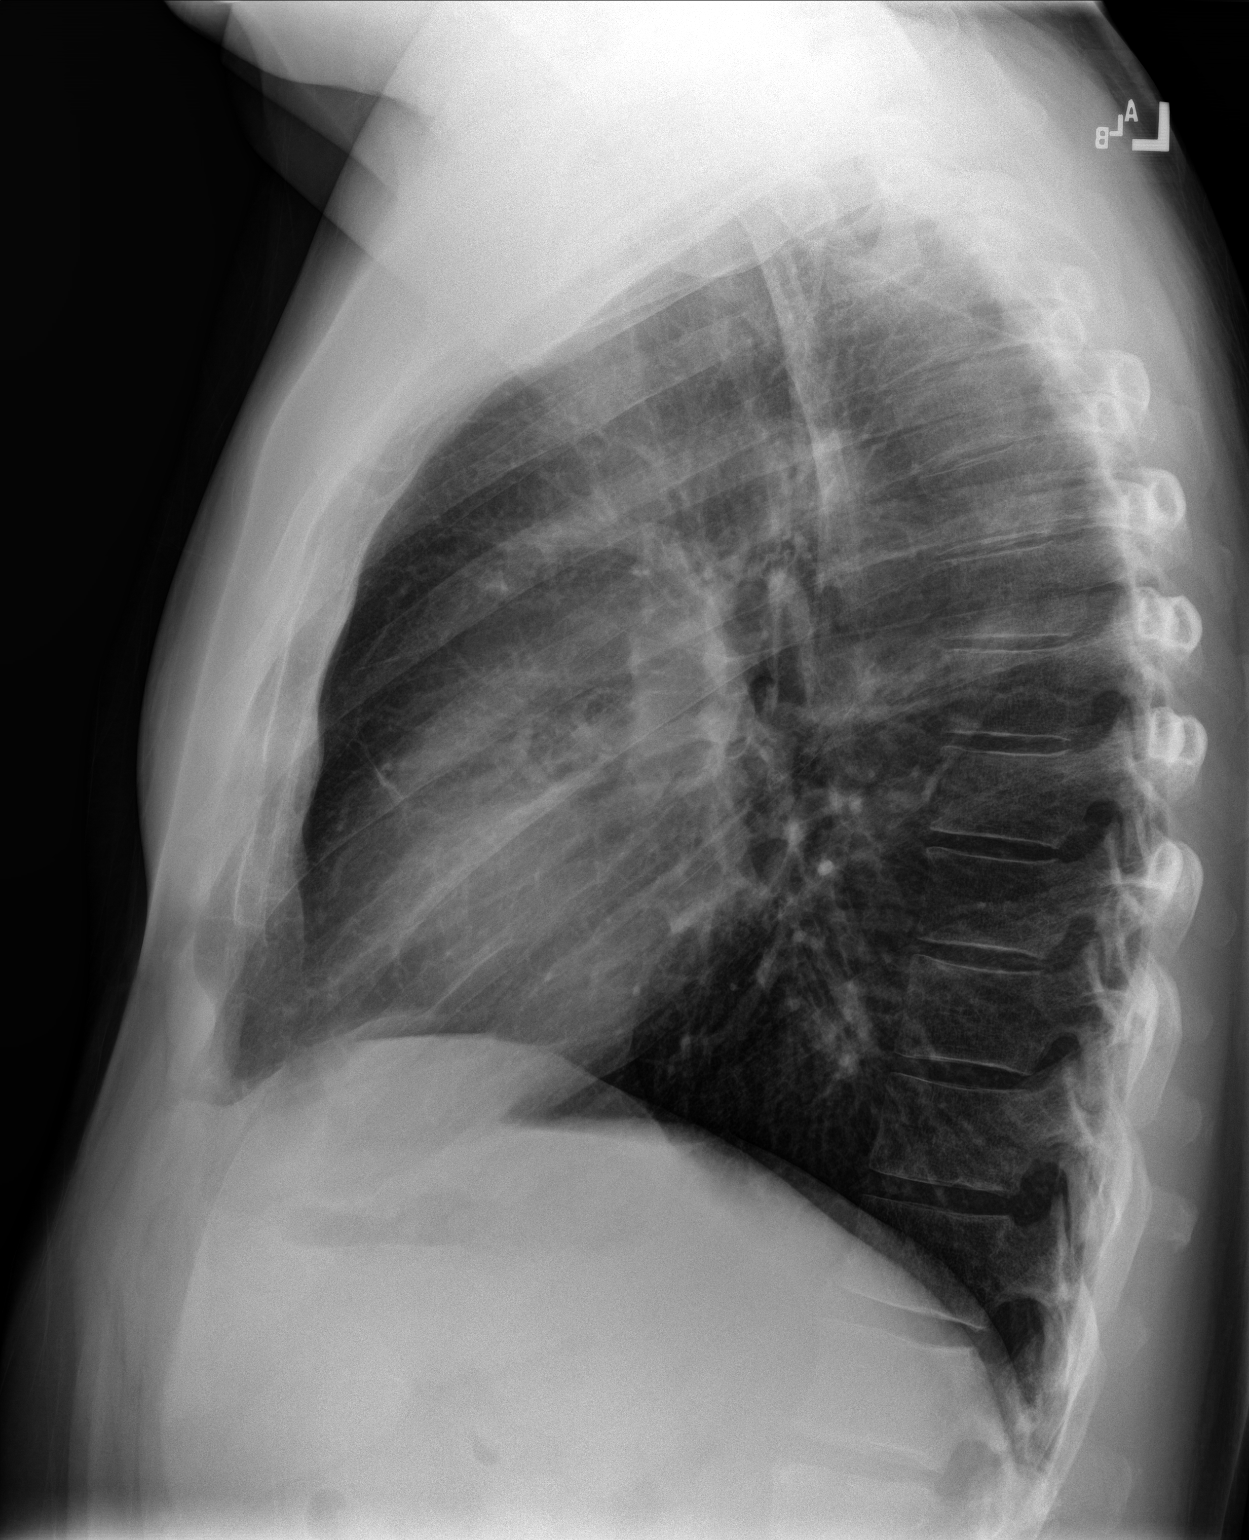

[2 of 2 positions shown; findings below may reference images not displayed]

FINDINGS: The heart size and mediastinal contours are within normal limits.
Both lungs are clear. The visualized skeletal structures are
unremarkable.
IMPRESSION: No active cardiopulmonary disease.

## 2024-01-31 ENCOUNTER — Encounter: Payer: Self-pay | Admitting: Surgery

## 2024-01-31 ENCOUNTER — Ambulatory Visit: Admitting: Surgery

## 2024-01-31 VITALS — BP 133/87 | HR 69 | Ht 73.0 in | Wt 221.0 lb

## 2024-01-31 DIAGNOSIS — D171 Benign lipomatous neoplasm of skin and subcutaneous tissue of trunk: Secondary | ICD-10-CM | POA: Diagnosis not present

## 2024-01-31 DIAGNOSIS — L723 Sebaceous cyst: Secondary | ICD-10-CM

## 2024-01-31 NOTE — Patient Instructions (Signed)
 We have spoken today about removing a Lipoma. This will be done by Dr. Mauri Sous at [facility].  If you are on any injectable weight loss medication, you will need to stop taking your GLP-1 injectable (weight loss) medications 8 days before your surgery to avoid any complications with anesthesia.   You will most likely be able to leave the hospital several hours after your surgery. You may need to have a drain placed after surgery, this depends on the size of your lipoma.  Plan to tentatively be off work for up to 1 week following the surgery.  Please see your Blue surgery sheet for more information. Our surgery scheduler will call you to look at surgery dates and to go over information.   If you have FMLA or Disability paperwork that needs to be filled out, please have your company fax your paperwork to [phone] or you may drop this by either office. This paperwork will be filled out within 3 days after your surgery has been completed.  Lipoma Removal  Lipoma removal is a surgical procedure to remove a lipoma, which is a noncancerous (benign) tumor that is made up of fat cells. Most lipomas are small and painless and do not require treatment. They can form in many areas of the body but are most common under the skin of the back, arms, shoulders, buttocks, and thighs. You may need lipoma removal if you have a lipoma that is large, growing, or causing discomfort. Lipoma removal may also be done for cosmetic reasons. Tell a health care provider about: Any allergies you have. All medicines you are taking, including vitamins, herbs, eye drops, creams, and over-the-counter medicines. Any problems you or family members have had with anesthetic medicines. Any bleeding problems you have. Any surgeries you have had. Any medical conditions you have. Whether you are pregnant or may be pregnant. What are the risks? Generally, this is a safe procedure. However, problems may occur,  including: Infection. Bleeding. Scarring. Allergic reactions to medicines. Damage to nearby structures or organs, such as damage to nerves or blood vessels near the lipoma. What happens before the procedure? Medicines Ask your health care provider about: Changing or stopping your regular medicines. This is especially important if you are taking diabetes medicines or blood thinners. Taking medicines such as aspirin and ibuprofen. These medicines can thin your blood. Do not take these medicines unless your health care provider tells you to take them. Taking over-the-counter medicines, vitamins, herbs, and supplements. General instructions You will have a physical exam. Your health care provider will check the size of the lipoma and whether it can be removed easily. You may have a biopsy and imaging tests, such as X-rays, a CT scan, and an MRI. Do not use any products that contain nicotine or tobacco for at least 4 weeks before the procedure. These products include cigarettes, chewing tobacco, and vaping devices, such as e-cigarettes. If you need help quitting, ask your health care provider. Ask your health care provider: How your surgery site will be marked. What steps will be taken to help prevent infection. These may include: Washing skin with a germ-killing soap. Taking antibiotic medicine. If you will be going home right after the procedure, plan to have a responsible adult: Take you home from the hospital or clinic. You will not be allowed to drive. Care for you for the time you are told. What happens during the procedure?  An IV will be inserted into one of your veins. You  will be given one or more of the following: A medicine to help you relax (sedative). A medicine to numb the area (local anesthetic). A medicine to make you fall asleep (general anesthetic). A medicine that is injected into an area of your body to numb everything below the injection site (regional anesthetic). An  incision will be made into the skin over the lipoma or very near the lipoma. The incision may be made in a natural skin line or crease. Tissues, nerves, and blood vessels near the lipoma will be moved out of the way. The lipoma and the capsule that surrounds it will be separated from the surrounding tissues. The lipoma will be removed. You may have a drain placed depending on the size of your lipoma The incision may be closed with stitches (sutures). A bandage (dressing) will be placed over the incision. The procedure may vary among health care providers and hospitals. What happens after the procedure? Your blood pressure, heart rate, breathing rate, and blood oxygen level will be monitored until you leave the hospital or clinic. If you were prescribed an antibiotic medicine, use it as told by your health care provider. Do not stop using the antibiotic even if you start to feel better. If you were given a sedative during the procedure, it can affect you for several hours. Do not drive or operate machinery until your health care provider says that it is safe. Where to find more information OrthoInfo: orthoinfo.aaos.org Summary Before the procedure, follow instructions from your health care provider about eating and drinking, and changing or stopping your regular medicines. This is especially important if you are taking diabetes medicines or blood thinners. After the lipoma is removed, the incision may be closed with stitches (sutures) and covered with a bandage (dressing). If you were given a sedative during the procedure, it can affect you for several hours. Do not drive or operate machinery until your health care provider says that it is safe.

## 2024-01-31 NOTE — Progress Notes (Unsigned)
 01/31/2024  Reason for Visit:  Right upper back lipoma  Requesting Provider:  Shasta Ranger, MD  History of Present Illness: Ricardo Daniels is a 53 y.o. male presenting for evaluation of a right upper back lipoma.  The patient reports that he has had this for a few years and has been slowly growing in size.  As it is growing, he has been causing some discomfort in the right posterior shoulder area particularly when he lies down on that side to sleep.  Denies any range of motion difficulties but the main symptom is pressure and discomfort in the right upper back/shoulder area.  Denies any overlying skin changes, drainage, or erythema.  He also does note a small cyst in the left upper back which his wife has from time to time squeezed and she reports that it releases foul smelling contents.  Denies any fevers, chills, chest pain, shortness of breath.  Denies any other areas of involvement.  Past Medical History: Past Medical History:  Diagnosis Date   GERD (gastroesophageal reflux disease)      Past Surgical History: Past Surgical History:  Procedure Laterality Date   BACK SURGERY      Home Medications: Prior to Admission medications   Not on File    Allergies: No Known Allergies  Social History:  reports that he has been smoking. He has never used smokeless tobacco. He reports that he does not drink alcohol and does not use drugs.   Family History: Family History  Problem Relation Age of Onset   Healthy Mother    Healthy Father     Review of Systems: Review of Systems  Constitutional:  Negative for chills and fever.  HENT:  Negative for hearing loss.   Respiratory:  Negative for shortness of breath.   Cardiovascular:  Negative for chest pain.  Gastrointestinal:  Negative for abdominal pain, nausea and vomiting.  Genitourinary:  Negative for dysuria.  Musculoskeletal:  Negative for myalgias.  Skin:        Mass in right upper back and cyst in left upper back   Neurological:  Negative for dizziness.  Psychiatric/Behavioral:  Negative for depression.     Physical Exam BP 133/87   Pulse 69   Ht 6' 1 (1.854 m)   Wt 221 lb (100.2 kg)   SpO2 98%   BMI 29.16 kg/m  CONSTITUTIONAL: No acute distress, well nourished. HEENT:  Normocephalic, atraumatic, extraocular motion intact. RESPIRATORY:  Lungs are clear, and breath sounds are equal bilaterally. Normal respiratory effort without pathologic use of accessory muscles. CARDIOVASCULAR: Heart is regular without murmurs, gallops, or rubs. MUSCULOSKELETAL:  Normal muscle strength and tone in all four extremities.  No peripheral edema or cyanosis. SKIN: The patient has an 8.5 cm mass in the right upper back posterior to the shoulder which is soft, mobile, without erythema or induration.  He also has a small area of scarring with skin pore consistent with sebaceous cyst measuring about 2 cm, without any active drainage or induration/erythema. NEUROLOGIC:  Motor and sensation is grossly normal.  Cranial nerves are grossly intact. PSYCH:  Alert and oriented to person, place and time. Affect is normal.  Laboratory Analysis: No results found for this or any previous visit (from the past 24 hours).  Imaging: No results found.  Assessment and Plan: This is a 53 y.o. male with a right upper back lipoma and left upper back sebaceous cyst.  -- Discussed with patient the findings on exam showing both right upper back  lipoma and a left upper back sebaceous cyst.  Discussed with him that both of these can be excised but given the size of the lipoma, I would recommend doing this in the operating room where we can have better control of any potential bleeding using cautery and other instrumentation.  The cyst itself could be removed either in the operating room at the same time or as an office procedure to decrease the amount of time in the operating room that the patient would prefer to just do everything all at  once. - Discussed with the patient then the plan for excision of right upper back lipoma and excision of left upper back sebaceous cyst.  Reviewed with him the surgery at length including the planned incisions, risks of bleeding, infection, injury to surrounding structures, that this would be an outpatient procedure, postoperative activity restrictions, pain control, and he is willing to proceed. - Will schedule patient for surgery on 02/29/2024.  All of his questions have been answered.  I spent 40 minutes dedicated to the care of this patient on the date of this encounter to include pre-visit review of records, face-to-face time with the patient discussing diagnosis and management, and any post-visit coordination of care.   Aloysius Sheree Plant, MD [facility]

## 2024-02-01 ENCOUNTER — Telehealth: Payer: Self-pay | Admitting: Surgery

## 2024-02-01 NOTE — Telephone Encounter (Signed)
 Patient has been advised of Pre-Admission date/time, and Surgery date at [facility].  Surgery Date: 02/29/24 Preadmission Testing Date: Preadmissions to call patient with appointment.   Patient informed of the scheduling process and surgery information given at time of office visit.   Patient has been made aware to call [phone], between 1-3:00pm the day before surgery, to find out what time to arrive for surgery.

## 2024-02-22 ENCOUNTER — Telehealth: Payer: Self-pay

## 2024-02-22 ENCOUNTER — Inpatient Hospital Stay
Admission: RE | Admit: 2024-02-22 | Discharge: 2024-02-22 | Disposition: A | Discharge status: Home / Self Care | Source: Ambulatory Visit

## 2024-02-22 NOTE — Patient Instructions (Signed)
 Your procedure is scheduled on: Report to the Registration Desk on the 1st floor of the [facility]. To find out your arrival time, please call [phone] between 1PM - 3PM on: If your arrival time is 6:00 am, do not arrive before that time as the [facility] entrance doors do not open until 6:00 am.  REMEMBER: Instructions that are not followed completely may result in serious medical risk, up to and including death; or upon the discretion of your surgeon and anesthesiologist your surgery may need to be rescheduled.  Do not eat food after midnight the night before surgery.  No gum chewing or hard candies.  You may however, drink CLEAR liquids up to 2 hours before you are scheduled to arrive for your surgery. Do not drink anything within 2 hours of your scheduled arrival time.  Clear liquids include: - water  - apple juice without pulp - gatorade (not RED colors) - black coffee or tea (Do NOT add milk or creamers to the coffee or tea) Do NOT drink anything that is not on this list.  **Type 1 and Type 2 diabetics should only drink water.**  In addition, your doctor has ordered for you to drink the provided:  Ensure Pre-Surgery Clear Carbohydrate Drink  Gatorade G2 Drinking this carbohydrate drink up to two hours before surgery helps to reduce insulin resistance and improve patient outcomes. Please complete drinking 2 hours before scheduled arrival time.  One week prior to surgery: Stop Anti-inflammatories (NSAIDS) such as Advil , Aleve , Ibuprofen , Motrin , Naproxen , Naprosyn  and Aspirin based products such as Excedrin, Goody's Powder, BC Powder. Stop ANY OVER THE COUNTER supplements until after surgery.  You may however, continue to take Tylenol  if needed for pain up until the day of surgery.  **Follow guidelines for insulin and diabetes medications.**  **Follow recommendations regarding stopping blood thinners.**  Continue taking all of your other prescription medications up  until the day of surgery.  ON THE DAY OF SURGERY ONLY TAKE THESE MEDICATIONS WITH SIPS OF WATER:    Use inhalers on the day of surgery and bring to the hospital.  Fleets enema or bowel prep as directed.  No Alcohol for 24 hours before or after surgery.  No Smoking including e-cigarettes for 24 hours before surgery.  No chewable tobacco products for at least 6 hours before surgery.  No nicotine patches on the day of surgery.  Do not use any recreational drugs for at least a week (preferably 2 weeks) before your surgery.  Please be advised that the combination of cocaine and anesthesia may have negative outcomes, up to and including death. If you test positive for cocaine, your surgery will be cancelled.  On the morning of surgery brush your teeth with toothpaste and water, you may rinse your mouth with mouthwash if you wish. Do not swallow any toothpaste or mouthwash.  Use CHG Soap or wipes as directed on instruction sheet.  Do not wear jewelry, make-up, hairpins, clips or nail polish.  For welded (permanent) jewelry: bracelets, anklets, waist bands, etc.  Please have this removed prior to surgery.  If it is not removed, there is a chance that hospital personnel will need to cut it off on the day of surgery.  Do not wear lotions, powders, or perfumes.   Do not shave body hair from the neck down 48 hours before surgery.  Contact lenses, hearing aids and dentures may not be worn into surgery.  Do not bring valuables to the hospital. [facility]  is not responsible for any missing/lost belongings or valuables.   Total Shoulder Arthroplasty:  use Benzoyl Peroxide 5% Gel as directed on instruction sheet.  Bring your C-PAP to the hospital in case you may have to spend the night.   Notify your doctor if there is any change in your medical condition (cold, fever, infection).  Wear comfortable clothing (specific to your surgery type) to the hospital.  After surgery, you can help  prevent lung complications by doing breathing exercises.  Take deep breaths and cough every 1-2 hours. Your doctor may order a device called an Incentive Spirometer to help you take deep breaths. When coughing or sneezing, hold a pillow firmly against your incision with both hands. This is called "splinting." Doing this helps protect your incision. It also decreases belly discomfort.  If you are being admitted to the hospital overnight, leave your suitcase in the car. After surgery it may be brought to your room.  In case of increased patient census, it may be necessary for you, the patient, to continue your postoperative care in the Same Day Surgery department.  If you are being discharged the day of surgery, you will not be allowed to drive home. You will need a responsible individual to drive you home and stay with you for 24 hours after surgery.   If you are taking public transportation, you will need to have a responsible individual with you.  Please call the Pre-admissions Testing Dept. at [phone] if you have any questions about these instructions.  Surgery Visitation Policy:  Patients having surgery or a procedure may have two visitors.  Children under the age of 27 must have an adult with them who is not the patient.  Inpatient Visitation:    Visiting hours are 7 a.m. to 8 p.m. Up to four visitors are allowed at one time in a patient room. The visitors may rotate out with other people during the day.  One visitor age 110 or older may stay with the patient overnight and must be in the room by 8 p.m.   Merchandiser, retail to address health-related social needs:  [email or web address]                                                                                                             Preparing for Surgery with CHLORHEXIDINE  GLUCONATE (CHG) Soap  Chlorhexidine  Gluconate (CHG) Soap  o An antiseptic cleaner that kills germs and bonds with the  skin to continue killing germs even after washing  o Used for showering the night before surgery and morning of surgery  Before surgery, you can play an important role by reducing the number of germs on your skin.  CHG (Chlorhexidine  gluconate) soap is an antiseptic cleanser which kills germs and bonds with the skin to continue killing germs even after washing.  Please do not use if you have an allergy to CHG or antibacterial soaps. If your skin becomes reddened/irritated stop using the CHG.  1. Shower the NIGHT BEFORE SURGERY with CHG soap.  2.  If you choose to wash your hair, wash your hair first as usual with your normal shampoo.  3. After shampooing, rinse your hair and body thoroughly to remove the shampoo.  4. Use CHG as you would any other liquid soap. You can apply CHG directly to the skin and wash gently with a clean washcloth.  5. Apply the CHG soap to your body only from the neck down. Do not use on open wounds or open sores. Avoid contact with your eyes, ears, mouth, and genitals (private parts). Wash face and genitals (private parts) with your normal soap.  6. Wash thoroughly, paying special attention to the area where your surgery will be performed.  7. Thoroughly rinse your body with warm water.  8. Do not shower/wash with your normal soap after using and rinsing off the CHG soap.  9. Do not use lotions, oils, etc., after showering with CHG.  10. Pat yourself dry with a clean towel.  11. Wear clean pajamas to bed the night before surgery.  12. Place clean sheets on your bed the night of your shower and do not sleep with pets.  13. Do not apply any deodorants/lotions/powders.  14. Please wear clean clothes to the hospital.  15. Remember to brush your teeth with your regular toothpaste.

## 2024-02-22 NOTE — Telephone Encounter (Signed)
 Patient's wife called and said that they are canceling his surgery. They cannot financially afford it right now. They may do this after the holidays. They will call back to schedule a follow up with Dr Desiderio once they decide on going forward with surgery.  OR notified about surgery cancellation.

## 2024-02-22 NOTE — Progress Notes (Signed)
 Attempted to call patient for pre-admit interview however the patient's  spouse stated that the patient wants to re-schedule the surgery for next year therefore, Pre-admit interview is cancelled at this time.Instructed patient to call Dr. Almarie office to update the plan.

## 2024-02-29 ENCOUNTER — Ambulatory Visit: Admission: RE | Admit: 2024-02-29 | Payer: Self-pay | Source: Home / Self Care | Admitting: Surgery

## 2024-02-29 ENCOUNTER — Encounter: Admission: RE | Payer: Self-pay | Source: Home / Self Care

## 2024-02-29 SURGERY — EXCISION MASS, BACK
Anesthesia: General | Laterality: Right

## 2024-04-05 ENCOUNTER — Ambulatory Visit

## 2024-04-05 ENCOUNTER — Ambulatory Visit: Payer: Self-pay | Admitting: Family Medicine

## 2024-04-05 ENCOUNTER — Ambulatory Visit
Admission: EM | Admit: 2024-04-05 | Discharge: 2024-04-05 | Disposition: A | Discharge status: Home / Self Care | Attending: Family Medicine | Admitting: Family Medicine

## 2024-04-05 DIAGNOSIS — M25512 Pain in left shoulder: Secondary | ICD-10-CM | POA: Diagnosis not present

## 2024-04-05 MED ORDER — NAPROXEN 500 MG PO TABS: 500.0000 mg | ORAL_TABLET | Freq: Two times a day (BID) | ORAL | 0 refills | Status: AC

## 2024-04-05 MED ORDER — GABAPENTIN 100 MG PO CAPS: 100.0000 mg | ORAL_CAPSULE | Freq: Every evening | ORAL | 0 refills | Status: AC | PRN

## 2024-04-05 NOTE — ED Provider Notes (Signed)
 " Ricardo Daniels    CSN: [id number] Arrival date & time: 04/05/24  9045      History   Chief Complaint Chief Complaint  Patient presents with   Shoulder Pain    HPI  HPI Ricardo Daniels is a 53 y.o. male.   Monica presents for left shoulder pain that started 1.5 weeks ago.  Has tingling in his left arm and hand that is getting worse to the point that it wakes him up at night.  Taking Advil for pain and tried a muscle relaxer which helps some. He can hold it in a certain place that doesn't make it hurt. Pain rated 1.5 /10 but can get way worse. Notes that he has a high pain tolerance.     Past Medical History:  Diagnosis Date   GERD (gastroesophageal reflux disease)     Patient Active Problem List   Diagnosis Date Noted   Right cervical radiculopathy 06/26/2021   Rotator cuff impingement syndrome of right shoulder 06/26/2021    Past Surgical History:  Procedure Laterality Date   BACK SURGERY         Home Medications    Prior to Admission medications  Medication Sig Start Date End Date Taking? Authorizing Provider  gabapentin  (NEURONTIN ) 100 MG capsule Take 1 capsule (100 mg total) by mouth at bedtime as needed. 04/05/24  Yes Jaycion Treml, DO  naproxen  (NAPROSYN ) 500 MG tablet Take 1 tablet (500 mg total) by mouth 2 (two) times daily with a meal. 04/05/24  Yes Chalmers Iddings, DO  SUMAtriptan  (IMITREX ) 20 MG/ACT nasal spray Place 20 mg into the nose every 2 (two) hours as needed for migraine or headache. May repeat in 2 hours if headache persists or recurs.    [provider]    Family History Family History  Problem Relation Age of Onset   Healthy Mother    Healthy Father     Social History Social History[1]   Allergies   Patient has no known allergies.   Review of Systems Review of Systems: :negative unless otherwise stated in HPI.      Physical Exam Triage Vital Signs ED Triage Vitals  Encounter Vitals Group     BP 04/05/24  1031 (!) 128/90     Girls Systolic BP Percentile --      Girls Diastolic BP Percentile --      Boys Systolic BP Percentile --      Boys Diastolic BP Percentile --      Pulse Rate 04/05/24 1031 74     Resp 04/05/24 1031 18     Temp 04/05/24 1031 98 F (36.7 C)     Temp Source 04/05/24 1031 Oral     SpO2 04/05/24 1031 100 %     Weight --      Height --      Head Circumference --      Peak Flow --      Pain Score 04/05/24 1029 3     Pain Loc --      Pain Education --      Exclude from Growth Chart --    No data found.  Updated Vital Signs BP (!) 128/90 (BP Location: Right Arm)   Pulse 74   Temp 98 F (36.7 C) (Oral)   Resp 18   SpO2 100%   Visual Acuity Right Eye Distance:   Left Eye Distance:   Bilateral Distance:    Right Eye Near:   Left Eye Near:  Bilateral Near:     Physical Exam GEN: well appearing male in no acute distress  CVS: well perfused  RESP: speaking in full sentences without pause, no respiratory distress  MSK:   Left shoulder:  No evidence of bony deformity, asymmetry, or muscle atrophy. No tenderness over long head of biceps (bicipital groove).  No TTP at [facility] joint.  Full active and passive (ABD, ADD, Flexion, extension, IR, ER).   Strength 5/5 grip, elbow and shoulder. No abnormal scapular function observed.  Special Tests: Hawkins: positive; Empty Can: negative, Neer's: Negative; Painful arc: Negative; Anterior Apprehension: Negative Sensation intact. Peripheral pulses intact.   UC Treatments / Results  Labs (all labs ordered are listed, but only abnormal results are displayed) Labs Reviewed - No data to display  EKG   Radiology DG Shoulder Left Result Date: 04/05/2024 CLINICAL DATA:  Left shoulder pain x2 weeks. EXAM: LEFT SHOULDER - 2+ VIEW COMPARISON:  None Available. FINDINGS: There is no evidence of fracture or dislocation. There is no evidence of arthropathy or other focal bone abnormality. Soft tissues are unremarkable.  IMPRESSION: Negative. Electronically Signed   By: Suzen Dials M.D.   On: 04/05/2024 12:03      Procedures Procedures (including critical Daniels time)  Medications Ordered in UC Medications - No data to display  Initial Impression / Assessment and Plan / UC Course  I have reviewed the triage vital signs and the nursing notes.  Pertinent labs & imaging results that were available during my Daniels of the patient were reviewed by me and considered in my medical decision making (see chart for details).      Pt is a 53 y.o.  male with 2 weeks of left shoulder pain .   On exam, pt has no bony tenderness and good ROM.   Obtained left shoulder plain films.  Personally interpreted by me were unremarkable for fracture or dislocation. Radiologist report reviewed.   Patient to gradually return to normal activities, as tolerated and continue ordinary activities within the limits permitted by pain. Prescribed Naproxen  sodium  and Gabapentin  for pain relief.  Tylenol PRN. Advised patient to avoid OTC NSAIDs while taking prescription NSAID. Counseled patient on red flag symptoms and when to seek immediate Daniels.  No red flags such as progressive major motor weakness.   Patient to follow up with orthopedic provider, if symptoms do not improve with conservative treatment.  Return and ED precautions given. Understanding voiced. Discussed MDM, treatment plan and plan for follow-up with patient who agrees with plan.   Final Clinical Impressions(s) / UC Diagnoses   Final diagnoses:  Acute pain of left shoulder     Discharge Instructions      Take the Gabapentin  to help with nerve related pain with numbness and tingling at night as needed.  Take the Naprosyn  twice a day as needed for joint pain.   On my review of your xray images, you did not have any fractures or dislocated bones.  The radiologist has not yet read your xray. If it is significantly abnormal or urgent, someone will contact you.  You  should see your results in MyChart.    If pain not improving, follow up with Orthopedics so please call one of the following office for appointment:   [facility] [address] [place], [place] [address] Phone: [phone]  [facility] [address] [place], [place] [address] Phone: [phone]      ED Prescriptions     Medication  Sig Dispense Auth. Provider   naproxen  (NAPROSYN ) 500 MG tablet Take 1 tablet (500 mg total) by mouth 2 (two) times daily with a meal. 30 tablet Pravin Perezperez, DO   gabapentin  (NEURONTIN ) 100 MG capsule Take 1 capsule (100 mg total) by mouth at bedtime as needed. 30 capsule Corben Auzenne, DO      PDMP not reviewed this encounter.     [1]  Social History Tobacco Use   Smoking status: Some Days   Smokeless tobacco: Never  Vaping Use   Vaping status: Never Used  Substance Use Topics   Alcohol use: No    Alcohol/week: 0.0 standard drinks of alcohol   Drug use: No     Kriste Berth, DO 04/19/24 1923  "

## 2024-04-05 NOTE — ED Triage Notes (Signed)
 Patient presents to UC for left shoulder pain x 2 weeks. Treating pain with advil. Concerned with a strain. 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 Reports pain is worse at night and has developed numbness/tingling x 5-6 days ago.

## 2024-04-05 NOTE — Discharge Instructions (Addendum)
 Take the Gabapentin  to help with nerve related pain with numbness and tingling at night as needed.  Take the Naprosyn  twice a day as needed for joint pain.   On my review of your xray images, you did not have any fractures or dislocated bones.  The radiologist has not yet read your xray. If it is significantly abnormal or urgent, someone will contact you.  You should see your results in MyChart.    If pain not improving, follow up with Orthopedics so please call one of the following office for appointment:   [facility] [address], [place], [place] [address] Phone: [phone]  [facility] [address] [place], [place] [address] Phone: [phone]

## 2024-04-20 ENCOUNTER — Ambulatory Visit
Admission: RE | Admit: 2024-04-20 | Discharge: 2024-04-20 | Disposition: A | Discharge status: Home / Self Care | Attending: Gastroenterology | Admitting: Gastroenterology

## 2024-04-20 ENCOUNTER — Ambulatory Visit: Payer: Self-pay | Admitting: Certified Registered"

## 2024-04-20 ENCOUNTER — Encounter: Payer: Self-pay | Admitting: Gastroenterology

## 2024-04-20 ENCOUNTER — Other Ambulatory Visit: Payer: Self-pay

## 2024-04-20 ENCOUNTER — Encounter
Admission: RE | Disposition: A | Discharge status: Home / Self Care | Payer: Self-pay | Source: Home / Self Care | Attending: Gastroenterology

## 2024-04-20 DIAGNOSIS — Z1211 Encounter for screening for malignant neoplasm of colon: Secondary | ICD-10-CM | POA: Insufficient documentation

## 2024-04-20 DIAGNOSIS — D123 Benign neoplasm of transverse colon: Secondary | ICD-10-CM | POA: Insufficient documentation

## 2024-04-20 DIAGNOSIS — F17200 Nicotine dependence, unspecified, uncomplicated: Secondary | ICD-10-CM | POA: Diagnosis not present

## 2024-04-20 HISTORY — PX: COLONOSCOPY: SHX5424

## 2024-04-20 HISTORY — PX: POLYPECTOMY: SHX149

## 2024-04-20 SURGERY — COLONOSCOPY
Anesthesia: General

## 2024-04-20 MED ORDER — PROPOFOL 500 MG/50ML IV EMUL
INTRAVENOUS | Status: DC | PRN
  Administered 2024-04-20: 130 ug/kg/min via INTRAVENOUS
  Administered 2024-04-20: 80 mg via INTRAVENOUS

## 2024-04-20 MED ORDER — STERILE WATER FOR IRRIGATION IR SOLN
Status: DC | PRN
  Administered 2024-04-20: 120 mL

## 2024-04-20 MED ORDER — SODIUM CHLORIDE 0.9 % IV SOLN: INTRAVENOUS | Status: DC

## 2024-04-20 MED ORDER — LIDOCAINE HCL (CARDIAC) PF 100 MG/5ML IV SOSY
PREFILLED_SYRINGE | INTRAVENOUS | Status: DC | PRN
  Administered 2024-04-20: 100 mg via INTRAVENOUS

## 2024-04-20 NOTE — Anesthesia Postprocedure Evaluation (Signed)
"   Anesthesia Post Note  Patient: Ricardo Daniels  Procedure(s) Performed: COLONOSCOPY POLYPECTOMY, INTESTINE  Patient location during evaluation: Endoscopy Anesthesia Type: General Level of consciousness: awake and alert Pain management: pain level controlled Vital Signs Assessment: post-procedure vital signs reviewed and stable Respiratory status: spontaneous breathing, nonlabored ventilation, respiratory function stable and patient connected to nasal cannula oxygen Cardiovascular status: blood pressure returned to baseline and stable Postop Assessment: no apparent nausea or vomiting Anesthetic complications: no   No notable events documented.   Last Vitals:  Vitals:   04/20/24 0756 04/20/24 0805  BP: (!) 83/58 114/70  Pulse: 69 74  Resp: 15 20  Temp:    SpO2: 97% 99%    Last Pain:  Vitals:   04/20/24 0805  TempSrc:   PainSc: 0-No pain                 Debby Mines      "

## 2024-04-20 NOTE — H&P (Signed)
 "                                                                                                                           Ruel Kung , MD [address], [address], [place], [place], [address] Phone: [phone] Fax: [phone]  Primary Care Physician:  Salli Amato, MD   Pre-Procedure History & Physical: HPI:  Ricardo Daniels is a 54 y.o. male is here for an colonoscopy.   Past Medical History:  Diagnosis Date   GERD (gastroesophageal reflux disease)     Past Surgical History:  Procedure Laterality Date   BACK SURGERY      Prior to Admission medications  Medication Sig Start Date End Date Taking? Authorizing Provider  gabapentin  (NEURONTIN ) 100 MG capsule Take 1 capsule (100 mg total) by mouth at bedtime as needed. 04/05/24   Brimage, Vondra, DO  naproxen  (NAPROSYN ) 500 MG tablet Take 1 tablet (500 mg total) by mouth 2 (two) times daily with a meal. 04/05/24   Brimage, Vondra, DO  SUMAtriptan  (IMITREX ) 20 MG/ACT nasal spray Place 20 mg into the nose every 2 (two) hours as needed for migraine or headache. May repeat in 2 hours if headache persists or recurs.    [provider]    Allergies as of 02/28/2024   (No Known Allergies)    Family History  Problem Relation Age of Onset   Healthy Mother    Healthy Father     Social History   Socioeconomic History   Marital status: Married    Spouse name: Not on file   Number of children: Not on file   Years of education: Not on file   Highest education level: Not on file  Occupational History   Not on file  Tobacco Use   Smoking status: Some Days   Smokeless tobacco: Never  Vaping Use   Vaping status: Never Used  Substance and Sexual Activity   Alcohol use: Yes    Comment: occasionally   Drug use: No   Sexual activity: Yes  Other Topics Concern   Not on file  Social History Narrative   Not on file   Social Drivers of Health   Tobacco Use: High Risk (04/20/2024)   Patient History    Smoking Tobacco  Use: Some Days    Smokeless Tobacco Use: Never    Passive Exposure: Not on file  Financial Resource Strain: Low Risk  (02/11/2024)   Received from [facility]   Overall Financial Resource Strain (CARDIA)    Difficulty of Paying Living Expenses: Not very hard  Food Insecurity: No Food Insecurity (02/11/2024)   Received from [facility]   Epic    Within the past 12 months, you worried that your food would run out before you got the money to buy more.: Never true    Within the past 12 months, the food you bought just didn't last and you didn't have money  to get more.: Never true  Transportation Needs: No Transportation Needs (02/11/2024)   Received from [facility] - Transportation    In the past 12 months, has lack of transportation kept you from medical appointments or from getting medications?: No    Lack of Transportation (Non-Medical): No  Physical Activity: Not on file  Stress: Not on file  Social Connections: Not on file  Intimate Partner Violence: Not on file  Depression (PHQ2-9): Low Risk (11/04/2021)   Depression (PHQ2-9)    PHQ-2 Score: 0  Alcohol Screen: Low Risk (06/26/2021)   Alcohol Screen    Last Alcohol Screening Score (AUDIT): 3  Housing: Low Risk  (02/11/2024)   Received from [facility]   Epic    In the last 12 months, was there a time when you were not able to pay the mortgage or rent on time?: No    In the past 12 months, how many times have you moved where you were living?: 0    At any time in the past 12 months, were you homeless or living in a shelter (including now)?: No  Utilities: Not At Risk (02/11/2024)   Received from [facility]   Epic    In the past 12 months has the electric, gas, oil, or water  company threatened to shut off services in your home?: No  Health Literacy: Not on file    Review of Systems: See HPI, otherwise negative ROS  Physical  Exam: BP 120/83   Pulse 73   Temp (!) 96.6 F (35.9 C) (Temporal)   Resp 18   Ht 6' (1.829 m)   Wt 99 kg   SpO2 (!) 1%   BMI 29.59 kg/m  General:   Alert,  pleasant and cooperative in NAD Head:  Normocephalic and atraumatic. Neck:  Supple; no masses or thyromegaly. Lungs:  Clear throughout to auscultation, normal respiratory effort.    Heart:  +S1, +S2, Regular rate and rhythm, No edema. Abdomen:  Soft, nontender and nondistended. Normal bowel sounds, without guarding, and without rebound.   Neurologic:  Alert and  oriented x4;  grossly normal neurologically.  Impression/Plan: Ricardo Daniels is here for an colonoscopy to be performed for Screening colonoscopy average risk   Risks, benefits, limitations, and alternatives regarding  colonoscopy have been reviewed with the patient.  Questions have been answered.  All parties agreeable.   Ruel Kung, MD  04/20/2024, 7:34 AM  "

## 2024-04-20 NOTE — Op Note (Signed)
 [facility] Gastroenterology Patient Name: Ricardo Daniels Procedure Date: 04/20/2024 7:36 AM MRN: [id number] Account #: [id number] Date of Birth: [birth year] Admit Type: Outpatient Age: 54 Room: [facility] ENDO ROOM 2 Gender: Male Note Status: Finalized Instrument Name: Colon Scope [id number] Procedure:             Colonoscopy Indications:           Screening for colorectal malignant neoplasm Providers:             Ruel Kung MD, MD Referring MD:          Salli Amato Medicines:             Monitored Anesthesia Care Complications:         No immediate complications. Procedure:             Pre-Anesthesia Assessment:                        - Prior to the procedure, a History and Physical was                         performed, and patient medications, allergies and                         sensitivities were reviewed. The patient's tolerance                         of previous anesthesia was reviewed.                        - The risks and benefits of the procedure and the                         sedation options and risks were discussed with the                         patient. All questions were answered and informed                         consent was obtained.                        - ASA Grade Assessment: II - A patient with mild                         systemic disease.                        After obtaining informed consent, the colonoscope was                         passed under direct vision. Throughout the procedure,                         the patient's blood pressure, pulse, and oxygen                         saturations were monitored continuously. The                         Colonoscope was introduced through the  anus and                         advanced to the the cecum, identified by the                         appendiceal orifice. The colonoscopy was performed                         with ease. The patient tolerated the procedure well.                          The quality of the bowel preparation was excellent.                         The ileocecal valve, appendiceal orifice, and rectum                         were photographed. Findings:      The perianal and digital rectal examinations were normal.      A 5 mm polyp was found in the transverse colon. The polyp was sessile.       The polyp was removed with a cold snare. Resection and retrieval were       complete.      A 3 mm polyp was found in the transverse colon. The polyp was sessile.       The polyp was removed with a jumbo cold forceps. Resection and retrieval       were complete.      The exam was otherwise without abnormality on direct and retroflexion       views. Impression:            - One 5 mm polyp in the transverse colon, removed with                         a cold snare. Resected and retrieved.                        - One 3 mm polyp in the transverse colon, removed with                         a jumbo cold forceps. Resected and retrieved.                        - The examination was otherwise normal on direct and                         retroflexion views. Recommendation:        - Discharge patient to home (with escort).                        - Resume previous diet.                        - Continue present medications.                        - Await pathology results.                        -  Repeat colonoscopy for surveillance based on                         pathology results. Procedure Code(s):     --- Professional ---                        [phone], Colonoscopy, flexible; with removal of                         tumor(s), polyp(s), or other lesion(s) by snare                         technique                        [id number], 59, Colonoscopy, flexible; with biopsy, single                         or multiple Diagnosis Code(s):     --- Professional ---                        Z12.11, Encounter for screening for malignant neoplasm                         of colon                         D12.3, Benign neoplasm of transverse colon (hepatic                         flexure or splenic flexure) CPT copyright 2022 [employer]. All rights reserved. The codes documented in this report are preliminary and upon coder review may  be revised to meet current compliance requirements. Ruel Kung, MD Ruel Kung MD, MD 04/20/2024 7:53:11 AM This report has been signed electronically. Number of Addenda: 0 Note Initiated On: 04/20/2024 7:36 AM Scope Withdrawal Time: 0 hours 9 minutes 31 seconds  Total Procedure Duration: 0 hours 11 minutes 24 seconds  Estimated Blood Loss:  Estimated blood loss: none.      [facility]

## 2024-04-20 NOTE — Transfer of Care (Signed)
 Immediate Anesthesia Transfer of Care Note  Patient: Ricardo Daniels  Procedure(s) Performed: COLONOSCOPY POLYPECTOMY, INTESTINE  Patient Location: PACU  Anesthesia Type:General  Level of Consciousness: awake  Airway & Oxygen Therapy: Patient Spontanous Breathing  Post-op Assessment: Report given to RN and Post -op Vital signs reviewed and stable  Post vital signs: stable  Last Vitals:  Vitals Value Taken Time  BP 83/58 04/20/24 07:55  Temp 36 C 04/20/24 07:55  Pulse 75 04/20/24 07:56  Resp 19 04/20/24 07:56  SpO2 98 % 04/20/24 07:56  Vitals shown include unfiled device data.  Last Pain:  Vitals:   04/20/24 0755  TempSrc: Temporal  PainSc:          Complications: No notable events documented.

## 2024-04-20 NOTE — Anesthesia Preprocedure Evaluation (Signed)
"                                    Anesthesia Evaluation  Patient identified by MRN, date of birth, ID band Patient awake    Reviewed: Allergy & Precautions, NPO status , Patient's Chart, lab work & pertinent test results  Airway Mallampati: III  TM Distance: >3 FB Neck ROM: full    Dental  (+) Chipped   Pulmonary neg pulmonary ROS, Current Smoker and Patient abstained from smoking.   Pulmonary exam normal        Cardiovascular negative cardio ROS Normal cardiovascular exam     Neuro/Psych  Neuromuscular disease  negative psych ROS   GI/Hepatic Neg liver ROS,GERD  ,,  Endo/Other  negative endocrine ROS    Renal/GU negative Renal ROS  negative genitourinary   Musculoskeletal   Abdominal   Peds  Hematology negative hematology ROS (+)   Anesthesia Other Findings Past Medical History: No date: GERD (gastroesophageal reflux disease)  Past Surgical History: No date: BACK SURGERY  BMI    Body Mass Index: 29.59 kg/m      Reproductive/Obstetrics negative OB ROS                              Anesthesia Physical Anesthesia Plan  ASA: 2  Anesthesia Plan: General   Post-op Pain Management: Minimal or no pain anticipated   Induction: Intravenous  PONV Risk Score and Plan: 2 and Propofol  infusion and TIVA  Airway Management Planned: Nasal Cannula  Additional Equipment: None  Intra-op Plan:   Post-operative Plan:   Informed Consent: I have reviewed the patients History and Physical, chart, labs and discussed the procedure including the risks, benefits and alternatives for the proposed anesthesia with the patient or authorized representative who has indicated his/her understanding and acceptance.     Dental advisory given  Plan Discussed with: CRNA and Surgeon  Anesthesia Plan Comments: (Discussed risks of anesthesia with patient, including possibility of difficulty with spontaneous ventilation under anesthesia  necessitating airway intervention, PONV, and rare risks such as cardiac or respiratory or neurological events, and allergic reactions. Discussed the role of CRNA in patient's perioperative care. Patient understands.)        Anesthesia Quick Evaluation  "

## 2024-04-21 LAB — SURGICAL PATHOLOGY

## 2024-05-10 ENCOUNTER — Ambulatory Visit: Payer: Self-pay | Admitting: Gastroenterology
